# Patient Record
Sex: Male | Born: 1955 | Race: White | Hispanic: No | Marital: Married | Smoking: Never smoker
Health system: Southern US, Community
[De-identification: ages and names within clinical notes are randomized; demographics above are authoritative.]

---

## 2017-04-02 DIAGNOSIS — C61 Malignant neoplasm of prostate: Secondary | ICD-10-CM

## 2017-04-02 HISTORY — DX: Malignant neoplasm of prostate: C61

## 2017-06-28 ENCOUNTER — Encounter: Payer: Self-pay | Admitting: Family Medicine

## 2017-06-28 ENCOUNTER — Ambulatory Visit: Payer: 59 | Admitting: Family Medicine

## 2017-06-28 VITALS — BP 118/80 | HR 63 | Ht 68.0 in | Wt 160.4 lb

## 2017-06-28 DIAGNOSIS — E7800 Pure hypercholesterolemia, unspecified: Secondary | ICD-10-CM | POA: Diagnosis not present

## 2017-06-28 DIAGNOSIS — E119 Type 2 diabetes mellitus without complications: Secondary | ICD-10-CM | POA: Diagnosis not present

## 2017-06-28 DIAGNOSIS — I10 Essential (primary) hypertension: Secondary | ICD-10-CM | POA: Insufficient documentation

## 2017-06-28 DIAGNOSIS — Z789 Other specified health status: Secondary | ICD-10-CM | POA: Diagnosis not present

## 2017-06-28 DIAGNOSIS — Z7289 Other problems related to lifestyle: Secondary | ICD-10-CM

## 2017-06-28 DIAGNOSIS — Z8546 Personal history of malignant neoplasm of prostate: Secondary | ICD-10-CM | POA: Diagnosis not present

## 2017-06-28 LAB — COMPREHENSIVE METABOLIC PANEL
ALBUMIN: 3.8 g/dL (ref 3.5–5.2)
ALK PHOS: 58 U/L (ref 39–117)
ALT: 108 U/L — ABNORMAL HIGH (ref 0–53)
AST: 46 U/L — ABNORMAL HIGH (ref 0–37)
BILIRUBIN TOTAL: 0.5 mg/dL (ref 0.2–1.2)
BUN: 19 mg/dL (ref 6–23)
CO2: 32 mEq/L (ref 19–32)
Calcium: 9.8 mg/dL (ref 8.4–10.5)
Chloride: 100 mEq/L (ref 96–112)
Creatinine, Ser: 1.43 mg/dL (ref 0.40–1.50)
GFR: 53.3 mL/min — ABNORMAL LOW (ref >60.00)
Glucose, Bld: 148 mg/dL — ABNORMAL HIGH (ref 70–99)
POTASSIUM: 3.7 meq/L (ref 3.5–5.1)
SODIUM: 139 meq/L (ref 135–145)
TOTAL PROTEIN: 6.3 g/dL (ref 6.0–8.3)

## 2017-06-28 LAB — URINALYSIS, ROUTINE W REFLEX MICROSCOPIC
BILIRUBIN URINE: NEGATIVE
Hgb urine dipstick: NEGATIVE
KETONES UR: NEGATIVE
LEUKOCYTES UA: NEGATIVE
NITRITE: NEGATIVE
RBC / HPF: NONE SEEN (ref 0–?)
Specific Gravity, Urine: 1.02 (ref 1.000–1.030)
Total Protein, Urine: 30 — AB
URINE GLUCOSE: NEGATIVE
Urobilinogen, UA: 0.2 (ref 0.0–1.0)
pH: 6.5 (ref 5.0–8.0)

## 2017-06-28 LAB — LIPID PANEL
CHOLESTEROL: 116 mg/dL (ref 0–200)
HDL: 60.1 mg/dL (ref >39.00)
LDL Cholesterol: 44 mg/dL (ref 0–99)
NonHDL: 55.55
Total CHOL/HDL Ratio: 2
Triglycerides: 60 mg/dL (ref 0.0–149.0)
VLDL: 12 mg/dL (ref 0.0–40.0)

## 2017-06-28 LAB — MICROALBUMIN / CREATININE URINE RATIO
Creatinine,U: 147.8 mg/dL
Microalb Creat Ratio: 16.8 mg/g (ref 0.0–30.0)
Microalb, Ur: 24.8 mg/dL — ABNORMAL HIGH (ref 0.0–1.9)

## 2017-06-28 LAB — CBC
HEMATOCRIT: 42.2 % (ref 39.0–52.0)
Hemoglobin: 13.8 g/dL (ref 13.0–17.0)
MCHC: 32.8 g/dL (ref 30.0–36.0)
MCV: 81.8 fl (ref 78.0–100.0)
Platelets: 165 10*3/uL (ref 150.0–400.0)
RBC: 5.15 Mil/uL (ref 4.22–5.81)
RDW: 13.4 % (ref 11.5–15.5)
WBC: 6.4 10*3/uL (ref 4.0–10.5)

## 2017-06-28 LAB — HEMOGLOBIN A1C: Hgb A1c MFr Bld: 6.3 % (ref 4.6–6.5)

## 2017-06-28 MED ORDER — ATORVASTATIN CALCIUM 40 MG PO TABS: 40.0000 mg | ORAL_TABLET | Freq: Every day | ORAL | 1 refills | Status: DC

## 2017-06-28 MED ORDER — LISINOPRIL 40 MG PO TABS: 40.0000 mg | ORAL_TABLET | Freq: Every day | ORAL | 1 refills | Status: DC

## 2017-06-28 MED ORDER — METFORMIN HCL ER 500 MG PO TB24: 500.0000 mg | ORAL_TABLET | Freq: Every day | ORAL | 1 refills | Status: DC

## 2017-06-28 NOTE — Progress Notes (Signed)
Subjective:  Patient ID: Elijah Goodman, male    DOB: 1956-03-15  Age: 62 y.o. MRN: [id number]  CC: Establish Care   HPI Elijah Goodman presents for follow-up of his hypertension, elevated cholesterol diabetes.  He ran out of his chlorthalidone about a month or so ago and tells me that his blood pressure remains in the less than 130 over less than 90 range without the chlorthalidone.  He has also had a 20 pound intentional weight loss and believes that this may have helped.  He continues to be compliant with his atorvastatin and lisinopril his cholesterol has been well controlled as well.  His diabetes is been well controlled on a single dose of nightly metformin.  Had a colonoscopy this past year where 7 polyps were removed.  Past medical history of prostate cancer that was treated with seeding.  He continues to be followed by urology for this issue.  Tells me that he does consume a 12 pack of beer on most weekends.  He does not drink when he has his great grandson who is 70 years old for the weekend.  He lives with his girlfriend.  He helps his sister take care of his aging parents.  Outpatient Medications Prior to Visit  Medication Sig Dispense Refill  . atorvastatin (LIPITOR) 40 MG tablet Take 40 mg by mouth daily.    . chlorthalidone (HYGROTON) 25 MG tablet Take 25 mg by mouth daily.    Marland Kitchen lisinopril (PRINIVIL,ZESTRIL) 40 MG tablet Take 40 mg by mouth daily.    . metFORMIN (GLUCOPHAGE-XR) 500 MG 24 hr tablet Take 500 mg by mouth daily.     No facility-administered medications prior to visit.     ROS Review of Systems  Constitutional: Negative.  Negative for chills, fever and unexpected weight change.  HENT: Negative.   Eyes: Negative.   Respiratory: Negative for shortness of breath.   Cardiovascular: Negative for chest pain.  Gastrointestinal: Negative.   Endocrine: Negative for polyphagia and polyuria.  Genitourinary: Negative for difficulty urinating, hematuria and urgency.    Musculoskeletal: Negative for gait problem and joint swelling.  Skin: Negative.   Allergic/Immunologic: Negative for immunocompromised state.  Neurological: Negative for headaches.  Hematological: Does not bruise/bleed easily.  Psychiatric/Behavioral: Negative.     Objective:  BP 118/80 (BP Location: Left Arm, Patient Position: Sitting, Cuff Size: Normal)   Pulse 63   Ht 5\' 8"  (1.727 m)   Wt 160 lb 6 oz (72.7 kg)   SpO2 97%   BMI 24.38 kg/m   BP Readings from Last 3 Encounters:  06/28/17 118/80    Wt Readings from Last 3 Encounters:  06/28/17 160 lb 6 oz (72.7 kg)    Physical Exam  Constitutional: He is oriented to person, place, and time. He appears well-developed and well-nourished. No distress.  HENT:  Head: Normocephalic and atraumatic.  Right Ear: External ear normal.  Left Ear: External ear normal.  Mouth/Throat: Oropharynx is clear and moist. No oropharyngeal exudate.  Eyes: Pupils are equal, round, and reactive to light. Conjunctivae are normal. Right eye exhibits no discharge. Left eye exhibits no discharge. No scleral icterus.  Neck: Neck supple. No JVD present. No tracheal deviation present. No thyromegaly present.  Cardiovascular: Normal rate, regular rhythm and normal heart sounds.  Pulmonary/Chest: Effort normal and breath sounds normal. No stridor.  Abdominal: Soft. Bowel sounds are normal. He exhibits no distension. There is no tenderness. There is no rebound and no guarding.  Lymphadenopathy:    He  has no cervical adenopathy.  Neurological: He is alert and oriented to person, place, and time.  Skin: Skin is warm and dry. He is not diaphoretic.  Psychiatric: He has a normal mood and affect. His behavior is normal.    No results found for: WBC, HGB, HCT, PLT, GLUCOSE, CHOL, TRIG, HDL, LDLDIRECT, LDLCALC, ALT, AST, NA, K, CL, CREATININE, BUN, CO2, TSH, PSA, INR, GLUF, HGBA1C, MICROALBUR  Patient was never admitted.  Assessment & Plan:   Elijah Goodman was seen  today for establish care.  Diagnoses and all orders for this visit:  Essential hypertension -     lisinopril (PRINIVIL,ZESTRIL) 40 MG tablet; Take 1 tablet (40 mg total) by mouth daily. -     CBC -     Comprehensive metabolic panel -     Urinalysis, Routine w reflex microscopic  Elevated LDL cholesterol level -     atorvastatin (LIPITOR) 40 MG tablet; Take 1 tablet (40 mg total) by mouth daily. -     Comprehensive metabolic panel -     Lipid panel  Type 2 diabetes mellitus without complication, without long-term current use of insulin (HCC) -     metFORMIN (GLUCOPHAGE-XR) 500 MG 24 hr tablet; Take 1 tablet (500 mg total) by mouth at bedtime. -     CBC -     Comprehensive metabolic panel -     Urinalysis, Routine w reflex microscopic -     Microalbumin / creatinine urine ratio -     Hemoglobin A1c  Alcohol use  History of prostate cancer   I have discontinued Elijah Goodman's chlorthalidone. I have also changed his atorvastatin, lisinopril, and metFORMIN.  Meds ordered this encounter  Medications  . atorvastatin (LIPITOR) 40 MG tablet    Sig: Take 1 tablet (40 mg total) by mouth daily.    Dispense:  100 tablet    Refill:  1  . lisinopril (PRINIVIL,ZESTRIL) 40 MG tablet    Sig: Take 1 tablet (40 mg total) by mouth daily.    Dispense:  100 tablet    Refill:  1  . metFORMIN (GLUCOPHAGE-XR) 500 MG 24 hr tablet    Sig: Take 1 tablet (500 mg total) by mouth at bedtime.    Dispense:  100 tablet    Refill:  1   Patient will continue to monitor his blood pressure.  Expressed some concern about his alcohol usage on the weekends.  Information was given to the patient regarding this matter.  Follow-up: Return in about 6 months (around 12/29/2017).  Elijah Maw, MD

## 2017-06-28 NOTE — Patient Instructions (Signed)
DASH Eating Plan DASH stands for "Dietary Approaches to Stop Hypertension." The DASH eating plan is a healthy eating plan that has been shown to reduce high blood pressure (hypertension). It may also reduce your risk for type 2 diabetes, heart disease, and stroke. The DASH eating plan may also help with weight loss. What are tips for following this plan? General guidelines  Avoid eating more than 2,300 mg (milligrams) of salt (sodium) a day. If you have hypertension, you may need to reduce your sodium intake to 1,500 mg a day.  Limit alcohol intake to no more than 1 drink a day for nonpregnant women and 2 drinks a day for men. One drink equals 12 oz of beer, 5 oz of wine, or 1 oz of hard liquor.  Work with your health care provider to maintain a healthy body weight or to lose weight. Ask what an ideal weight is for you.  Get at least 30 minutes of exercise that causes your heart to beat faster (aerobic exercise) most days of the week. Activities may include walking, swimming, or biking.  Work with your health care provider or diet and nutrition specialist (dietitian) to adjust your eating plan to your individual calorie needs. Reading food labels  Check food labels for the amount of sodium per serving. Choose foods with less than 5 percent of the Daily Value of sodium. Generally, foods with less than 300 mg of sodium per serving fit into this eating plan.  To find whole grains, look for the word "whole" as the first word in the ingredient list. Shopping  Buy products labeled as "low-sodium" or "no salt added."  Buy fresh foods. Avoid canned foods and premade or frozen meals. Cooking  Avoid adding salt when cooking. Use salt-free seasonings or herbs instead of table salt or sea salt. Check with your health care provider or pharmacist before using salt substitutes.  Do not fry foods. Cook foods using healthy methods such as baking, boiling, grilling, and broiling instead.  Cook with  heart-healthy oils, such as olive, canola, soybean, or sunflower oil. Meal planning   Eat a balanced diet that includes: ? 5 or more servings of fruits and vegetables each day. At each meal, try to fill half of your plate with fruits and vegetables. ? Up to 6-8 servings of whole grains each day. ? Less than 6 oz of lean meat, poultry, or fish each day. A 3-oz serving of meat is about the same size as a deck of cards. One egg equals 1 oz. ? 2 servings of low-fat dairy each day. ? A serving of nuts, seeds, or beans 5 times each week. ? Heart-healthy fats. Healthy fats called Omega-3 fatty acids are found in foods such as flaxseeds and coldwater fish, like sardines, salmon, and mackerel.  Limit how much you eat of the following: ? Canned or prepackaged foods. ? Food that is high in trans fat, such as fried foods. ? Food that is high in saturated fat, such as fatty meat. ? Sweets, desserts, sugary drinks, and other foods with added sugar. ? Full-fat dairy products.  Do not salt foods before eating.  Try to eat at least 2 vegetarian meals each week.  Eat more home-cooked food and less restaurant, buffet, and fast food.  When eating at a restaurant, ask that your food be prepared with less salt or no salt, if possible. What foods are recommended? The items listed may not be a complete list. Talk with your dietitian about what   dietary choices are best for you. Grains Whole-grain or whole-wheat bread. Whole-grain or whole-wheat pasta. Brown rice. Oatmeal. Quinoa. Bulgur. Whole-grain and low-sodium cereals. Pita bread. Low-fat, low-sodium crackers. Whole-wheat flour tortillas. Vegetables Fresh or frozen vegetables (raw, steamed, roasted, or grilled). Low-sodium or reduced-sodium tomato and vegetable juice. Low-sodium or reduced-sodium tomato sauce and tomato paste. Low-sodium or reduced-sodium canned vegetables. Fruits All fresh, dried, or frozen fruit. Canned fruit in natural juice (without  added sugar). Meat and other protein foods Skinless chicken or turkey. Ground chicken or turkey. Pork with fat trimmed off. Fish and seafood. Egg whites. Dried beans, peas, or lentils. Unsalted nuts, nut butters, and seeds. Unsalted canned beans. Lean cuts of beef with fat trimmed off. Low-sodium, lean deli meat. Dairy Low-fat (1%) or fat-free (skim) milk. Fat-free, low-fat, or reduced-fat cheeses. Nonfat, low-sodium ricotta or cottage cheese. Low-fat or nonfat yogurt. Low-fat, low-sodium cheese. Fats and oils Soft margarine without trans fats. Vegetable oil. Low-fat, reduced-fat, or light mayonnaise and salad dressings (reduced-sodium). Canola, safflower, olive, soybean, and sunflower oils. Avocado. Seasoning and other foods Herbs. Spices. Seasoning mixes without salt. Unsalted popcorn and pretzels. Fat-free sweets. What foods are not recommended? The items listed may not be a complete list. Talk with your dietitian about what dietary choices are best for you. Grains Baked goods made with fat, such as croissants, muffins, or some breads. Dry pasta or rice meal packs. Vegetables Creamed or fried vegetables. Vegetables in a cheese sauce. Regular canned vegetables (not low-sodium or reduced-sodium). Regular canned tomato sauce and paste (not low-sodium or reduced-sodium). Regular tomato and vegetable juice (not low-sodium or reduced-sodium). Pickles. Olives. Fruits Canned fruit in a light or heavy syrup. Fried fruit. Fruit in cream or butter sauce. Meat and other protein foods Fatty cuts of meat. Ribs. Fried meat. Bacon. Sausage. Bologna and other processed lunch meats. Salami. Fatback. Hotdogs. Bratwurst. Salted nuts and seeds. Canned beans with added salt. Canned or smoked fish. Whole eggs or egg yolks. Chicken or turkey with skin. Dairy Whole or 2% milk, cream, and half-and-half. Whole or full-fat cream cheese. Whole-fat or sweetened yogurt. Full-fat cheese. Nondairy creamers. Whipped toppings.  Processed cheese and cheese spreads. Fats and oils Butter. Stick margarine. Lard. Shortening. Ghee. Bacon fat. Tropical oils, such as coconut, palm kernel, or palm oil. Seasoning and other foods Salted popcorn and pretzels. Onion salt, garlic salt, seasoned salt, table salt, and sea salt. Worcestershire sauce. Tartar sauce. Barbecue sauce. Teriyaki sauce. Soy sauce, including reduced-sodium. Steak sauce. Canned and packaged gravies. Fish sauce. Oyster sauce. Cocktail sauce. Horseradish that you find on the shelf. Ketchup. Mustard. Meat flavorings and tenderizers. Bouillon cubes. Hot sauce and Tabasco sauce. Premade or packaged marinades. Premade or packaged taco seasonings. Relishes. Regular salad dressings. Where to find more information:  National Heart, Lung, and Blood Institute: www.nhlbi.nih.gov  American Heart Association: www.heart.org Summary  The DASH eating plan is a healthy eating plan that has been shown to reduce high blood pressure (hypertension). It may also reduce your risk for type 2 diabetes, heart disease, and stroke.  With the DASH eating plan, you should limit salt (sodium) intake to 2,300 mg a day. If you have hypertension, you may need to reduce your sodium intake to 1,500 mg a day.  When on the DASH eating plan, aim to eat more fresh fruits and vegetables, whole grains, lean proteins, low-fat dairy, and heart-healthy fats.  Work with your health care provider or diet and nutrition specialist (dietitian) to adjust your eating plan to your individual   calorie needs. This information is not intended to replace advice given to you by your health care provider. Make sure you discuss any questions you have with your health care provider. Document Released: 03/08/2011 Document Revised: 03/12/2016 Document Reviewed: 03/12/2016 Elsevier Interactive Patient Education  2018 [employer].  What You Need To Know About Alcohol Abuse and Dependence, Adult Alcohol is a widely  available drug. People who use alcohol will consume it in varying amounts. People who drink alcohol in excess, and have behavior problems during and after drinking alcohol, may have what is called an alcohol use disorder. Alcohol abuse and alcohol dependence are the two main types of alcohol use disorders:  Alcohol abuse is when you use alcohol too much or too often. You may use alcohol to make yourself feel happy or to reduce stress, but you may have a hard time setting a limit on the amount you drink.  Alcohol dependence is when you use alcohol excessively for a period of time, and your body and brain chemistry changes as a result. This can make it hard to stop drinking because you may start to feel sick or feel different when you do not use alcohol.  How can alcohol abuse and dependence affect me? Alcohol abuse and dependence can have a negative effect on your life. Excessive use of alcohol may lead to an addiction. You may feel like you need alcohol to function normally. You may drink alcohol before work in the morning, during the day, or as soon as you get home from work in the evening. These actions can result in:  Poor performance at work.  Losing your job.  Financial problems.  Car crashes or criminal charges from driving after drinking alcohol.  Problems in your relationships with friends and family.  Losing the trust and respect of co-workers, friends, and family.  Drinking heavily over a long period of time can permanently damage your body and brain, and can cause lifelong health issues, such as:  Liver disease.  Heart problems, high blood pressure, or stroke.  Damage to your pancreas.  Certain cancers.  Decreased ability to fight infections.  Numbness or tingling in hands or feet (neuropathy).  Brain damage.  Depression.  Early (premature) death.  When your body craves alcohol, it is easy to drink more than your body can handle. As a result, you may overdose.  Alcohol overdose is a serious situation that requires hospitalization. It may lead to permanent injuries or death. What are the benefits of avoiding alcohol use? Limiting or avoiding alcohol can help you:  Avoid risks to your body, brain, and relationships.  Avoid the risk of abusing or becoming dependent on alcohol.  Keep your mind and body healthy. As a result, you may be more likely to accomplish your life goals.  Avoid permanent injury, organ damage, or death due to alcohol use.  What steps can I take to stop drinking?  The best way to avoid alcohol abuse, dependence, and addiction is not to drink at all, or to drink measured amounts. Measured drinking means no more than 1 drink a day for nonpregnant women and 2 drinks a day for men. One drink equals 12 oz of beer, 5 oz of wine, or 1 oz of hard liquor.  Stop drinking if you have been drinking too much. This can be very hard to do if you are used to abusing alcohol. If you find it hard to stop drinking, talk about your experience with someone you trust. This  person may be able to help you change your drinking behavior.  Instead of drinking alcohol, do something else, like a hobby or exercise.  Find healthy ways to cope with stress, such as exercise, meditation, or spending time with people you care about.  In social gatherings and places where there may be alcohol, make intentional choices to drink non-alcohol beverages.  If your family, co-workers, or friends drink, talk to them about supporting you in your efforts to stop drinking. Ask them not to drink around you. Spend more time with people who do not drink alcohol.  If you think that you have an alcohol dependency problem: ? Tell friends or family about your concerns. ? Talk with your health care provider or another health professional about where to get help. ? Work with a Transport planner and a Regulatory affairs officer. ? Consider joining a support group for people who struggle  with alcohol abuse, dependence, and addiction. Where to find support: You can get support for preventing alcohol abuse, dependence, and addiction from:  Your health care provider.  Alcoholics Anonymous (AA): NicTax.com.pt  SMART Recovery: www.smartrecovery.org  Local treatment centers or chemical dependency counselors.  Where to find more information: Learn more about alcohol abuse and dependence from:  Centers for Disease Control and Prevention: GoalForum.com.au  [employer] on Alcohol Abuse and Alcoholism: https://clark.org/  Local AA groups in your community.  Contact a health care provider if:  You drink more or for longer than you intended, on more than one occasion.  You tried to stop drinking or to cut back on how much you drink, but you were not able to.  You often drink to the point of vomiting or passing out.  You want to drink so badly that you cannot think about anything else.  Drinking has created problems in your life, but you continue to drink.  You keep drinking even though you feel anxious, depressed, or have experienced memory loss.  You have stopped doing the things you used to enjoy in order to drink.  You have to drink more than you used to in order to get the effect you want.  You experience anxiety, sweating, nausea, shakiness, and trouble sleeping when you try to stop drinking.  You have thoughts about hurting yourself or others. If you ever feel like you may hurt yourself or others, or have thoughts about taking your own life, get help right away. You can go to your nearest emergency department or call:  Your local emergency services (911 in the [place]  A suicide crisis helpline, such as the [place] at (404)791-6426. This is open 24 hours a day.  Summary  Alcohol is a widely available drug. Misusing, abusing, and becoming dependent on  alcohol can cause many problems.  It is important to measure and limit the amount of alcohol you consume. It is recommended to limit alcohol use to 1 drink a day for nonpregnant women and 2 drinks a day for men.  The risks associated with drinking too much will have a direct negative impact on your work, relationships, and health.  If you realize that you are having some challenges keeping your drinking under control, find some ways to change your behavior. Hobbies, self calming activities, exercise, or support groups can help.  If you feel you need help with changing your drinking habits, talk with your health care provider, a good friend, or a therapist, or go to an [place] group. This information is not intended to  replace advice given to you by your health care provider. Make sure you discuss any questions you have with your health care provider. Document Released: 03/13/2016 Document Revised: 03/13/2016 Document Reviewed: 03/13/2016 Elsevier Interactive Patient Education  [employer].

## 2017-07-04 ENCOUNTER — Other Ambulatory Visit: Payer: Self-pay

## 2017-07-04 DIAGNOSIS — R748 Abnormal levels of other serum enzymes: Secondary | ICD-10-CM

## 2017-07-24 ENCOUNTER — Telehealth: Payer: Self-pay

## 2017-07-24 DIAGNOSIS — Z8546 Personal history of malignant neoplasm of prostate: Secondary | ICD-10-CM

## 2017-07-24 NOTE — Telephone Encounter (Signed)
Referral entered as requested by Dr. Puschinsky's office.    Copied from [place]. Topic: Referral - Request >> Jul 24, 2017 10:53 AM Bea Graff, NT wrote: Reason for CRM: Dawn with Dr. Harlow Asa, urologist in [facility] is needing a new referral with the pts [facility] insurance so they can continue to treat pt for prostate cancer. CB#: [phone]

## 2017-07-25 ENCOUNTER — Telehealth: Payer: Self-pay

## 2017-07-25 NOTE — Telephone Encounter (Signed)
WK-Plz see message below/pt's ins req new referral for new insurance to continue Tx of Prostate Cancer/Dr. Puschinsky's office would like Tanya B to call back at [phone]/plz advise/thx dmf  Copied from [place]. Topic: Referral - Request >> Jul 24, 2017 10:53 AM Bea Graff, NT wrote: Reason for CRM: Dawn with Dr. Harlow Asa, urologist in [facility] is needing a new referral with the pts [facility] insurance so they can continue to treat pt for prostate cancer. CB#: [phone]  >> Jul 25, 2017  1:49 PM Conception Chancy, NT wrote: Dr. Harlow Asa office is calling and would like Tanya to call back. Please advise.  [phone]

## 2017-09-02 DIAGNOSIS — C61 Malignant neoplasm of prostate: Secondary | ICD-10-CM | POA: Diagnosis not present

## 2017-09-17 ENCOUNTER — Ambulatory Visit (INDEPENDENT_AMBULATORY_CARE_PROVIDER_SITE_OTHER): Payer: 59

## 2017-09-17 ENCOUNTER — Ambulatory Visit: Payer: 59 | Admitting: Family Medicine

## 2017-09-17 ENCOUNTER — Encounter: Payer: Self-pay | Admitting: Family Medicine

## 2017-09-17 VITALS — BP 138/90 | HR 87 | Temp 98.0°F | Ht 68.0 in | Wt 156.4 lb

## 2017-09-17 DIAGNOSIS — I10 Essential (primary) hypertension: Secondary | ICD-10-CM

## 2017-09-17 DIAGNOSIS — M25511 Pain in right shoulder: Secondary | ICD-10-CM | POA: Diagnosis not present

## 2017-09-17 DIAGNOSIS — R51 Headache: Secondary | ICD-10-CM

## 2017-09-17 DIAGNOSIS — Z789 Other specified health status: Secondary | ICD-10-CM

## 2017-09-17 DIAGNOSIS — R748 Abnormal levels of other serum enzymes: Secondary | ICD-10-CM

## 2017-09-17 DIAGNOSIS — E119 Type 2 diabetes mellitus without complications: Secondary | ICD-10-CM | POA: Diagnosis not present

## 2017-09-17 DIAGNOSIS — R519 Headache, unspecified: Secondary | ICD-10-CM

## 2017-09-17 DIAGNOSIS — Z7289 Other problems related to lifestyle: Secondary | ICD-10-CM

## 2017-09-17 LAB — COMPREHENSIVE METABOLIC PANEL
ALBUMIN: 4.1 g/dL (ref 3.5–5.2)
ALK PHOS: 70 U/L (ref 39–117)
ALT: 38 U/L (ref 0–53)
AST: 20 U/L (ref 0–37)
BUN: 17 mg/dL (ref 6–23)
CO2: 27 mEq/L (ref 19–32)
Calcium: 10 mg/dL (ref 8.4–10.5)
Chloride: 103 mEq/L (ref 96–112)
Creatinine, Ser: 0.79 mg/dL (ref 0.40–1.50)
GFR: 105.64 mL/min (ref >60.00)
GLUCOSE: 95 mg/dL (ref 70–99)
POTASSIUM: 3.6 meq/L (ref 3.5–5.1)
Sodium: 138 mEq/L (ref 135–145)
TOTAL PROTEIN: 6.5 g/dL (ref 6.0–8.3)
Total Bilirubin: 0.3 mg/dL (ref 0.2–1.2)

## 2017-09-17 LAB — GAMMA GT: GGT: 28 U/L (ref 7–51)

## 2017-09-17 MED ORDER — DICLOFENAC SODIUM 1 % TD GEL: TRANSDERMAL | 0 refills | Status: DC
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
## 2017-09-18 LAB — HEPATITIS B SURFACE ANTIGEN: HEP B S AG: NONREACTIVE

## 2017-09-20 LAB — ETHYL GLUCURONIDE LC/MS/MS
ETHYL GLUCURONIDE LC/MS/MS: 11571 ng/mL
ETHYL SULFATE: POSITIVE — AB
EtG/EtS LC/MS/MS: POSITIVE — AB
Ethyl Glucuronide: POSITIVE — AB
Ethyl Sulfate LC/MS/MS: 2975 ng/mL

## 2017-09-20 LAB — ETHYL GLUCURONIDE, URINE

## 2017-09-23 ENCOUNTER — Telehealth: Payer: Self-pay

## 2017-09-23 MED ORDER — LISINOPRIL 40 MG PO TABS: 40.0000 mg | ORAL_TABLET | Freq: Every day | ORAL | 0 refills | Status: DC

## 2017-09-23 MED ORDER — METFORMIN HCL ER 500 MG PO TB24: 500.0000 mg | ORAL_TABLET | Freq: Every day | ORAL | 0 refills | Status: DC

## 2017-09-23 NOTE — Telephone Encounter (Signed)
Copied from [place] [id number]. Topic: Quick Communication - Lab Results >> Sep 18, 2017  5:03 PM Oliver Pila B wrote: Reason for CRM: call pt when image results are ready  >> Sep 20, 2017  4:47 PM Selinda Flavin B, NT wrote: Patient calling in regards to xray image results. States it has been a few days. Please advise. CB#: [phone]

## 2017-09-23 NOTE — Addendum Note (Signed)
Addended by: Abelino Derrick A on: 09/23/2017 11:45 AM   Modules accepted: Orders

## 2017-11-04 ENCOUNTER — Encounter: Payer: Self-pay | Admitting: Family Medicine

## 2017-11-04 ENCOUNTER — Ambulatory Visit: Payer: 59 | Admitting: Family Medicine

## 2017-11-04 VITALS — BP 132/70 | HR 89 | Temp 98.3°F | Ht 68.0 in | Wt 156.2 lb

## 2017-11-04 DIAGNOSIS — M25511 Pain in right shoulder: Secondary | ICD-10-CM | POA: Diagnosis not present

## 2017-11-04 MED ORDER — METHYLPREDNISOLONE ACETATE 40 MG/ML IJ SUSP: 40.0000 mg | Freq: Once | INTRAMUSCULAR | Status: DC

## 2017-11-04 NOTE — Progress Notes (Signed)
Elijah Goodman - 62 y.o. male MRN [id number]  Date of birth: [birth year]  Subjective Chief Complaint  Patient presents with  . Shoulder Pain    pain ahs gotten worse, had cortisone shot about a month ago amd now the pain radiates down to elbow, cannot lift arm about shoulder at all.    HPI Elijah Goodman is a 62 y.o. male here today with complaint of R shoulder pain.  He was seen for this previously on 09/17/17 and received an injection to the R shoulder. He states that this helped for about 4 weeks and then the shoulder started bothering him again.  He has difficulty lifting the arm and with internal rotation of the shoulder.  He denies weakness into the arm, numbness, tingling, neck pain, fever, or chills.   ROS:  A comprehensive ROS was completed and negative except as noted per HPI  No Known Allergies  History reviewed. No pertinent past medical history.  History reviewed. No pertinent surgical history.  Social History   Socioeconomic History  . Marital status: Married    Spouse name: Not on file  . Number of children: Not on file  . Years of education: Not on file  . Highest education level: Not on file  Occupational History  . Not on file  Social Needs  . Financial resource strain: Not on file  . Food insecurity:    Worry: Not on file    Inability: Not on file  . Transportation needs:    Medical: Not on file    Non-medical: Not on file  Tobacco Use  . Smoking status: Never Smoker  . Smokeless tobacco: Never Used  Substance and Sexual Activity  . Alcohol use: Not on file  . Drug use: Not on file  . Sexual activity: Not on file  Lifestyle  . Physical activity:    Days per week: Not on file    Minutes per session: Not on file  . Stress: Not on file  Relationships  . Social connections:    Talks on phone: Not on file    Gets together: Not on file    Attends religious service: Not on file    Active member of club or organization: Not on file    Attends meetings of  clubs or organizations: Not on file    Relationship status: Not on file  Other Topics Concern  . Not on file  Social History Narrative  . Not on file    History reviewed. No pertinent family history.  Health Maintenance  Topic Date Due  . Hepatitis C Screening  March 08, 1956  . PNEUMOCOCCAL POLYSACCHARIDE VACCINE (1) 09/26/1957  . FOOT EXAM  09/26/1965  . OPHTHALMOLOGY EXAM  09/26/1965  . HIV Screening  09/27/1970  . TETANUS/TDAP  09/27/1974  . COLONOSCOPY  09/26/2005  . INFLUENZA VACCINE  10/31/2017  . HEMOGLOBIN A1C  12/29/2017    ----------------------------------------------------------------------------------------------------------------------------------------------------------------------------------------------------------------- Physical Exam BP 132/70 (BP Location: Left Arm, Patient Position: Sitting, Cuff Size: Normal)   Pulse 89   Temp 98.3 F (36.8 C) (Oral)   Ht 5\' 8"  (1.727 m)   Wt 156 lb 3.2 oz (70.9 kg)   SpO2 97%   BMI 23.75 kg/m   Physical Exam  Constitutional: He is oriented to person, place, and time. He appears well-nourished. No distress.  HENT:  Head: Normocephalic and atraumatic.  Neck: Normal range of motion. Neck supple.  Cardiovascular: Normal rate and regular rhythm.  Pulmonary/Chest: Effort normal and breath sounds normal.  Musculoskeletal:  R shoulder normal to inspection and palpation.  No ttp along biciptal groove.  He has limited ROM especially with abduction, internal rotation and cross body movement.  He has a +empty can.  Neurological: He is alert and oriented to person, place, and time.  Skin: Skin is warm and dry. No rash noted.  Psychiatric: He has a normal mood and affect. His behavior is normal.   Procedure note:  Reviewed procedure of subacromial injection and all questions answered.  Consent obtained.  The R shoulder was then prepped in typical sterile fashion using betadine x3.  A cold spray was applied and the R subacromial  space was injected using a posterior approach with 78ml of 40mg /mL depo-medrol and 87mL of 2% licdocaine.  A band aid was applied.  He tolerated procedure well and post procedure instructions were given.  ------------------------------------------------------------------------------------------------------------------------------------------------------------------------------------------------------------------- Assessment and Plan  Acute pain of right shoulder Likely rotator cuff tendinitis/subacromial bursitis.  Discussed repeat injection.  >6 weeks since last injection.  Agreed to have repeat injection, see procedure note. Some relief immediately after injection.  If not improving with this, recommend referral to sports medicine.

## 2017-11-04 NOTE — Patient Instructions (Signed)
Rotator Cuff Tendinitis Rotator cuff tendinitis is inflammation of the tough, cord-like bands that connect muscle to bone (tendons) in the rotator cuff. The rotator cuff includes all of the muscles and tendons that connect the arm to the shoulder. The rotator cuff holds the head of the upper arm bone (humerus) in the cup (fossa) of the shoulder blade (scapula). This condition can lead to a long-lasting (chronic) tear. The tear may be partial or complete. What are the causes? This condition is usually caused by overusing the rotator cuff. What increases the risk? This condition is more likely to develop in athletes and workers who frequently use their shoulder or reach over their heads. This can include activities such as:  Tennis.  Baseball or softball.  Swimming.  Construction work.  Painting.  What are the signs or symptoms? Symptoms of this condition include:  Pain spreading (radiating) from the shoulder to the upper arm.  Swelling and tenderness in front of the shoulder.  Pain when reaching, pulling, or lifting the arm above the head.  Pain when lowering the arm from above the head.  Minor pain in the shoulder when resting.  Increased pain in the shoulder at night.  Difficulty placing the arm behind the back.  How is this diagnosed? This condition is diagnosed with a medical history and physical exam. Tests may also be done, including:  X-rays.  MRI.  Ultrasounds.  CT or MR arthrogram. During this test, a contrast material is injected and then images are taken.  How is this treated? Treatment for this condition depends on the severity of the condition. In less severe cases, treatment may include:  Rest. This may be done with a sling that holds the shoulder still (immobilization). Your health care provider may also recommend avoiding activities that involve lifting your arm over your head.  Icing the shoulder.  Anti-inflammatory medicines, such as aspirin or  ibuprofen.  In more severe cases, treatment may include:  Physical therapy.  Steroid injections.  Surgery.  Follow these instructions at home: If you have a sling:  Wear the sling as told by your health care provider. Remove it only as told by your health care provider.  Loosen the sling if your fingers tingle, become numb, or turn cold and blue.  Keep the sling clean.  If the sling is not waterproof, do not let it get wet. Remove it, if allowed, or cover it with a watertight covering when you take a bath or shower. Managing pain, stiffness, and swelling  If directed, put ice on the injured area. ? If you have a removable sling, remove it as told by your health care provider. ? Put ice in a plastic bag. ? Place a towel between your skin and the bag. ? Leave the ice on for 20 minutes, 2-3 times a day.  Move your fingers often to avoid stiffness and to lessen swelling.  Raise (elevate) the injured area above the level of your heart while you are lying down.  Find a comfortable sleeping position or sleep on a recliner, if available. Driving  Do not drive or use heavy machinery while taking prescription pain medicine.  Ask your health care provider when it is safe to drive if you have a sling on your arm. Activity  Rest your shoulder as told by your health care provider.  Return to your normal activities as told by your health care provider. Ask your health care provider what activities are safe for you.  Do any   exercises or stretches as told by your health care provider.  If you do repetitive overhead tasks, take small breaks in between and include stretching exercises as told by your health care provider. General instructions  Do not use any products that contain nicotine or tobacco, such as cigarettes and e-cigarettes. These can delay healing. If you need help quitting, ask your health care provider.  Take over-the-counter and prescription medicines only as told by  your health care provider.  Keep all follow-up visits as told by your health care provider. This is important. Contact a health care provider if:  Your pain gets worse.  You have new pain in your arm, hands, or fingers.  Your pain is not relieved with medicine or does not get better after 6 weeks of treatment.  You have cracking sensations when moving your shoulder in certain directions.  You hear a snapping sound after using your shoulder, followed by severe pain and weakness. Get help right away if:  Your arm, hand, or fingers are numb or tingling.  Your arm, hand, or fingers are swollen or painful or they turn white or blue. Summary  Rotator cuff tendinitis is inflammation of the tough, cord-like bands that connect muscle to bone (tendons) in the rotator cuff.  This condition is usually caused by overusing the rotator cuff, which includes all of the muscles and tendons that connect the arm to the shoulder.  This condition is more likely to develop in athletes and workers who frequently use their shoulder or reach over their heads.  Treatment generally includes rest, anti-inflammatory medicines, and icing. In some cases, physical therapy and steroid injections may be needed. In severe cases, surgery may be needed. This information is not intended to replace advice given to you by your health care provider. Make sure you discuss any questions you have with your health care provider. Document Released: 06/09/2003 Document Revised: 03/05/2016 Document Reviewed: 03/05/2016 Elsevier Interactive Patient Education  2017 [employer]  

## 2017-11-04 NOTE — Assessment & Plan Note (Signed)
Likely rotator cuff tendinitis/subacromial bursitis.  Discussed repeat injection.  >6 weeks since last injection.  Agreed to have repeat injection, see procedure note. Some relief immediately after injection.  If not improving with this, recommend referral to sports medicine.

## 2018-01-14 ENCOUNTER — Other Ambulatory Visit: Payer: Self-pay | Admitting: Family Medicine

## 2018-01-14 DIAGNOSIS — I10 Essential (primary) hypertension: Secondary | ICD-10-CM

## 2018-02-25 ENCOUNTER — Other Ambulatory Visit: Payer: Self-pay | Admitting: Family Medicine

## 2018-02-25 DIAGNOSIS — E119 Type 2 diabetes mellitus without complications: Secondary | ICD-10-CM

## 2018-03-03 ENCOUNTER — Other Ambulatory Visit: Payer: Self-pay | Admitting: Family Medicine

## 2018-03-03 DIAGNOSIS — E7800 Pure hypercholesterolemia, unspecified: Secondary | ICD-10-CM

## 2018-06-09 ENCOUNTER — Other Ambulatory Visit: Payer: Self-pay | Admitting: Family Medicine

## 2018-06-09 DIAGNOSIS — E119 Type 2 diabetes mellitus without complications: Secondary | ICD-10-CM

## 2018-06-09 DIAGNOSIS — E7800 Pure hypercholesterolemia, unspecified: Secondary | ICD-10-CM

## 2018-07-21 ENCOUNTER — Other Ambulatory Visit: Payer: Self-pay | Admitting: Family Medicine

## 2018-07-21 DIAGNOSIS — I10 Essential (primary) hypertension: Secondary | ICD-10-CM

## 2018-09-11 ENCOUNTER — Other Ambulatory Visit: Payer: Self-pay | Admitting: Family Medicine

## 2018-09-11 DIAGNOSIS — E7800 Pure hypercholesterolemia, unspecified: Secondary | ICD-10-CM

## 2018-10-17 ENCOUNTER — Other Ambulatory Visit: Payer: Self-pay | Admitting: Family Medicine

## 2018-10-17 DIAGNOSIS — I10 Essential (primary) hypertension: Secondary | ICD-10-CM

## 2018-10-17 NOTE — Telephone Encounter (Signed)
Over due for follow up

## 2018-12-13 ENCOUNTER — Other Ambulatory Visit: Payer: Self-pay | Admitting: Family Medicine

## 2018-12-13 DIAGNOSIS — I10 Essential (primary) hypertension: Secondary | ICD-10-CM

## 2018-12-13 DIAGNOSIS — E119 Type 2 diabetes mellitus without complications: Secondary | ICD-10-CM

## 2018-12-13 DIAGNOSIS — E7800 Pure hypercholesterolemia, unspecified: Secondary | ICD-10-CM

## 2019-01-16 ENCOUNTER — Other Ambulatory Visit: Payer: Self-pay | Admitting: Family Medicine

## 2019-01-16 DIAGNOSIS — E119 Type 2 diabetes mellitus without complications: Secondary | ICD-10-CM

## 2019-01-16 DIAGNOSIS — I10 Essential (primary) hypertension: Secondary | ICD-10-CM

## 2019-02-24 IMAGING — DX DG SHOULDER 2+V*R*
3 series · 3 of 3 positions shown · non-contrast
Comparison: None.

CLINICAL DATA: Acute right shoulder pain for 2 weeks, no known
injury, initial encounter

EXAM:
RIGHT SHOULDER - 2+ VIEW

[shoulder (grashey) ap]
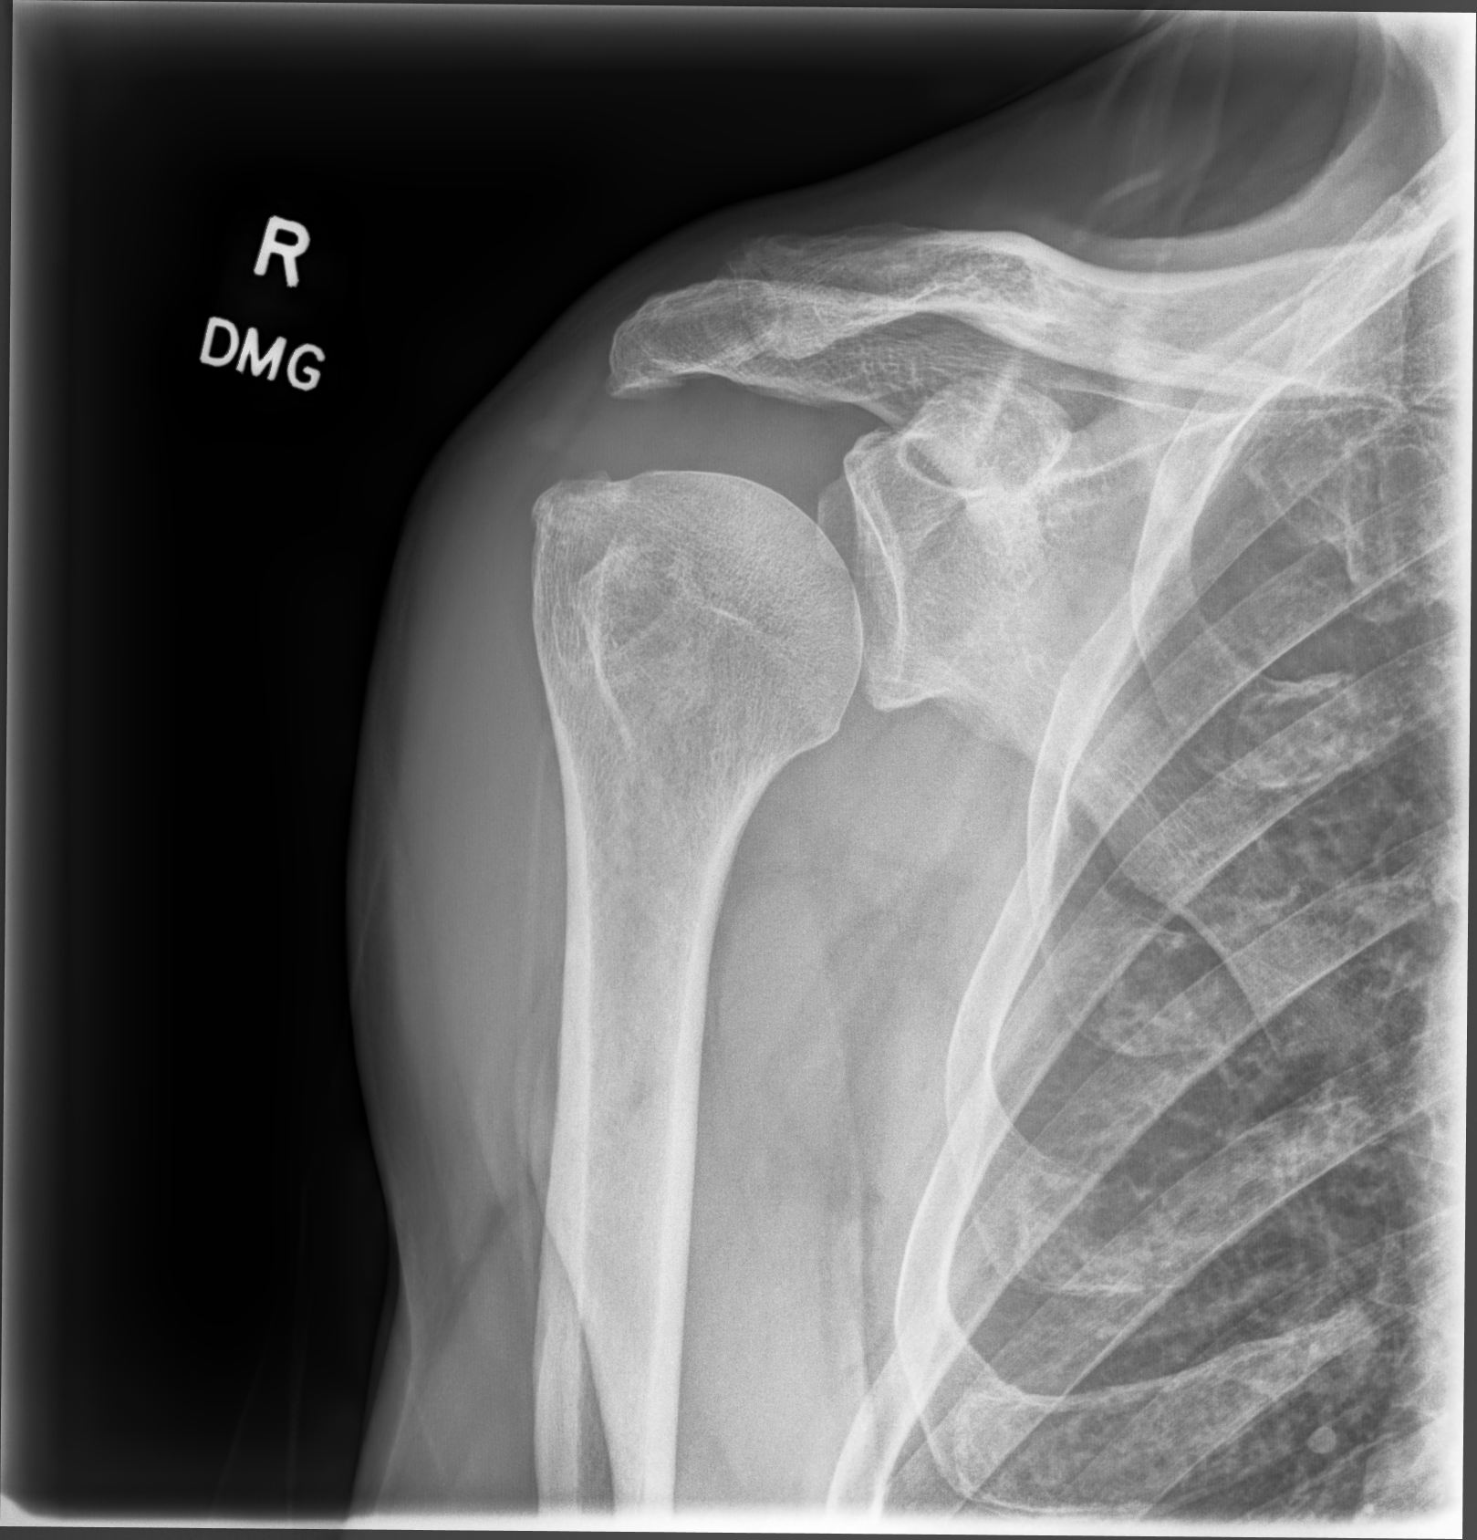

[shoulder y view]
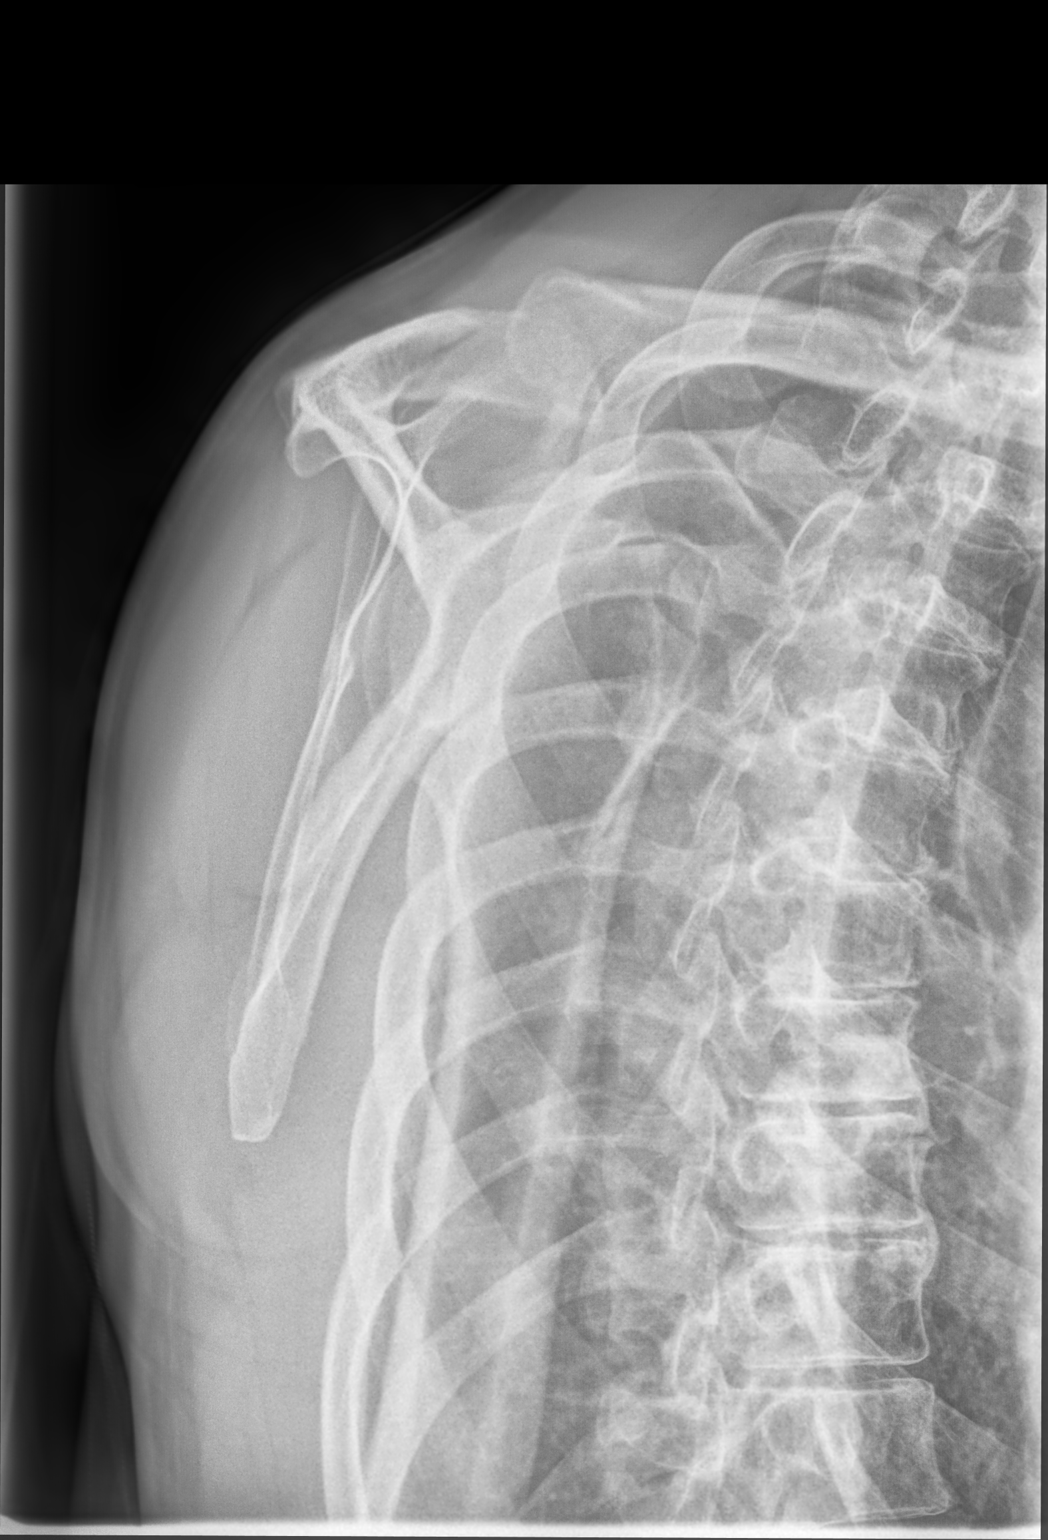

[shoulder axial]
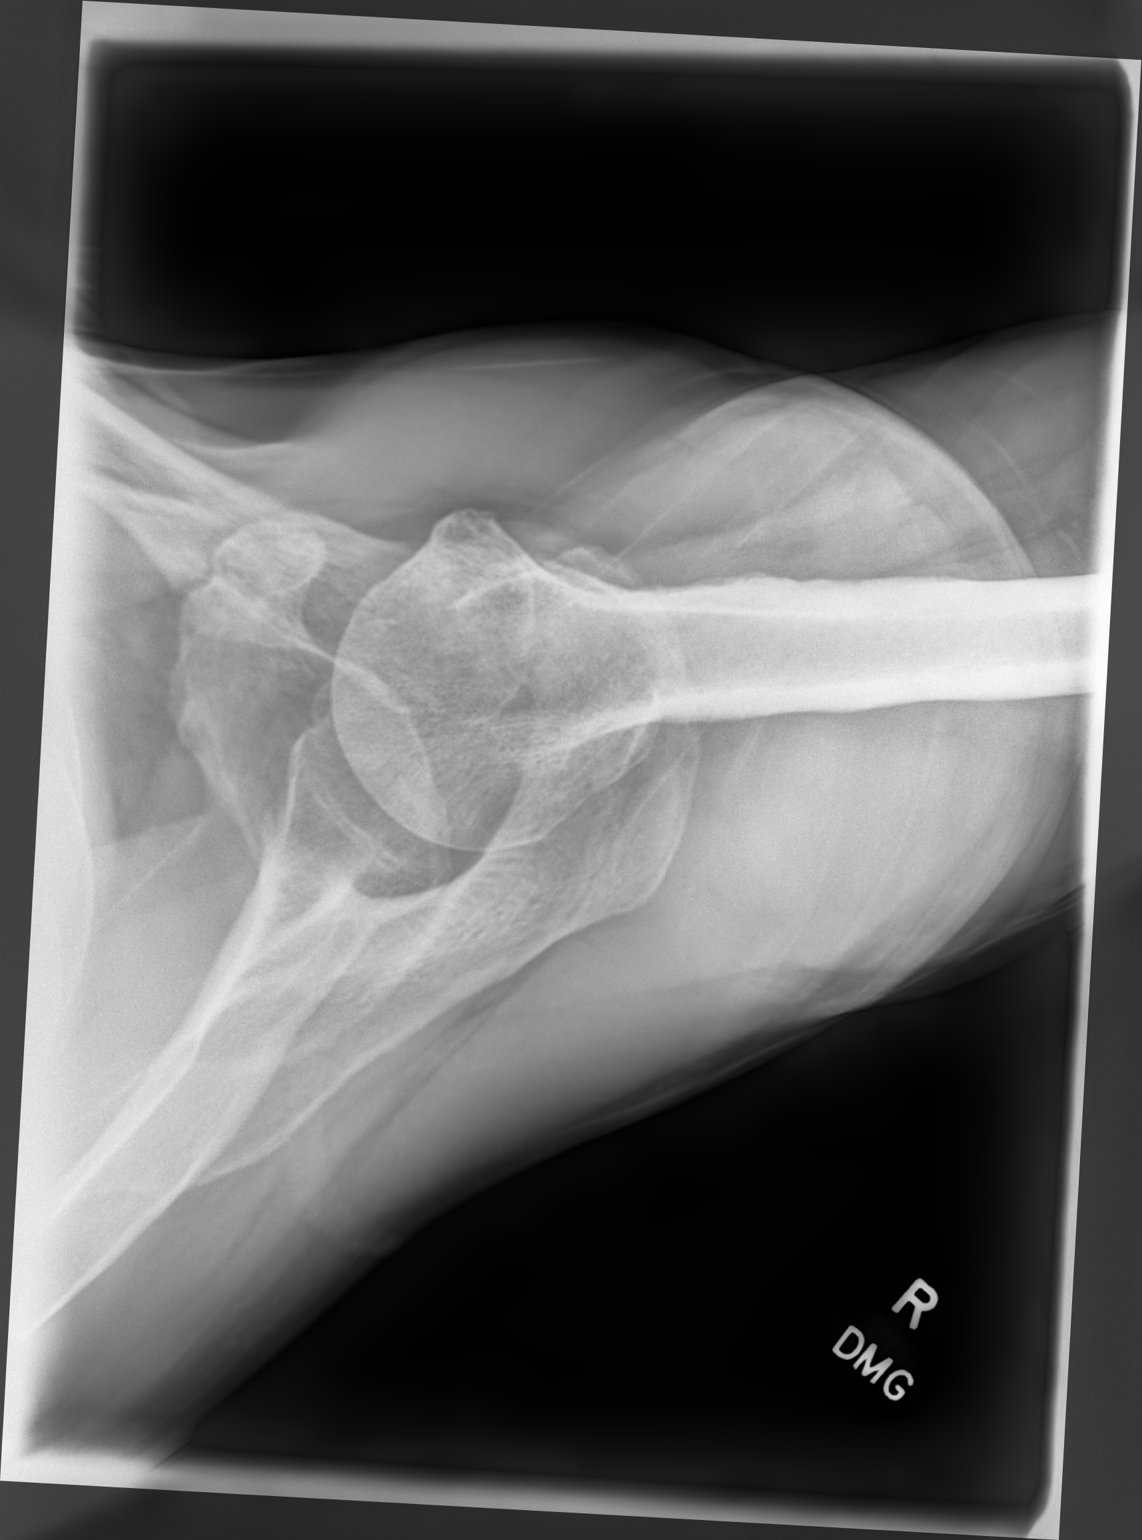

[3 of 3 positions shown; findings below may reference images not displayed]

FINDINGS: Mild degenerative changes of the acromioclavicular joint are noted.
No acute fracture or dislocation is seen. The underlying bony thorax
is within normal limits.
IMPRESSION: No acute abnormality noted.

## 2019-03-19 ENCOUNTER — Other Ambulatory Visit: Payer: Self-pay | Admitting: Family Medicine

## 2019-03-19 DIAGNOSIS — E7800 Pure hypercholesterolemia, unspecified: Secondary | ICD-10-CM

## 2019-03-19 DIAGNOSIS — E119 Type 2 diabetes mellitus without complications: Secondary | ICD-10-CM

## 2019-03-19 NOTE — Telephone Encounter (Signed)
Attempted to reach pt, phone just rang a fast busy signal. Pt has not been seen in a year. He needs an office visit

## 2019-03-24 NOTE — Telephone Encounter (Signed)
Attempted to reach pt, phone just rang a fast busy signal

## 2019-04-13 ENCOUNTER — Other Ambulatory Visit: Payer: Self-pay | Admitting: Family Medicine

## 2019-04-13 DIAGNOSIS — I10 Essential (primary) hypertension: Secondary | ICD-10-CM

## 2019-04-23 ENCOUNTER — Other Ambulatory Visit: Payer: Self-pay | Admitting: Family Medicine

## 2019-04-23 DIAGNOSIS — E7800 Pure hypercholesterolemia, unspecified: Secondary | ICD-10-CM

## 2019-04-23 NOTE — Telephone Encounter (Signed)
Called pt to schedule appointment and let him know that his Rx was called in, no answer LMTCB

## 2019-05-13 ENCOUNTER — Telehealth: Payer: Self-pay | Admitting: Family Medicine

## 2019-05-13 NOTE — Telephone Encounter (Signed)
Patient is calling and requesting a refill for Lisinopril sent to [facility]. CB is [phone]

## 2019-05-13 NOTE — Telephone Encounter (Signed)
Called pt to schedule an appointment and to inform that Rx have been refill. No answer LMTCB, will file message over 2 weeks with no response from patient.

## 2019-05-15 NOTE — Telephone Encounter (Signed)
Patient is calling to check the status of medication. CB is [phone]

## 2019-05-18 NOTE — Telephone Encounter (Signed)
Pt needing to come in for follow up on medications. Dr. Just refilled a Rx we have tried to reach him to schedule an appointment left several messages with no return call only calling for refills. Next time pt call would you see if he will schedule an appointment next available slot.

## 2019-05-27 ENCOUNTER — Other Ambulatory Visit: Payer: Self-pay

## 2019-05-27 DIAGNOSIS — I10 Essential (primary) hypertension: Secondary | ICD-10-CM

## 2019-05-27 MED ORDER — LISINOPRIL 40 MG PO TABS: 40.0000 mg | ORAL_TABLET | Freq: Every day | ORAL | 0 refills | Status: DC

## 2019-05-27 NOTE — Telephone Encounter (Signed)
Pt called back and asked if he can get about a weeks worth to hold him over until his appointment since he is out and he said he has been getting a headache form his bp being elevated. His best number is [phone]

## 2019-05-27 NOTE — Telephone Encounter (Signed)
Rx sent in, tried to call patient to inform him that the requested Rx have been sent to the pharmacy, no answer unable to LM.

## 2019-05-27 NOTE — Telephone Encounter (Signed)
Please just send him in a few weeks of med. Lets remember totalk to him about his phone situation.

## 2019-06-04 ENCOUNTER — Other Ambulatory Visit: Payer: Self-pay

## 2019-06-05 ENCOUNTER — Ambulatory Visit (INDEPENDENT_AMBULATORY_CARE_PROVIDER_SITE_OTHER): Payer: 59 | Admitting: Family Medicine

## 2019-06-05 ENCOUNTER — Encounter: Payer: Self-pay | Admitting: Family Medicine

## 2019-06-05 VITALS — BP 152/76 | HR 76 | Temp 98.1°F | Ht 68.0 in

## 2019-06-05 DIAGNOSIS — R748 Abnormal levels of other serum enzymes: Secondary | ICD-10-CM

## 2019-06-05 DIAGNOSIS — Z789 Other specified health status: Secondary | ICD-10-CM

## 2019-06-05 DIAGNOSIS — I10 Essential (primary) hypertension: Secondary | ICD-10-CM | POA: Diagnosis not present

## 2019-06-05 DIAGNOSIS — Z7289 Other problems related to lifestyle: Secondary | ICD-10-CM | POA: Diagnosis not present

## 2019-06-05 DIAGNOSIS — I491 Atrial premature depolarization: Secondary | ICD-10-CM | POA: Diagnosis not present

## 2019-06-05 DIAGNOSIS — E119 Type 2 diabetes mellitus without complications: Secondary | ICD-10-CM | POA: Diagnosis not present

## 2019-06-05 DIAGNOSIS — E7800 Pure hypercholesterolemia, unspecified: Secondary | ICD-10-CM

## 2019-06-05 DIAGNOSIS — I482 Chronic atrial fibrillation, unspecified: Secondary | ICD-10-CM | POA: Insufficient documentation

## 2019-06-05 MED ORDER — ATORVASTATIN CALCIUM 40 MG PO TABS: ORAL_TABLET | ORAL | 0 refills | Status: DC

## 2019-06-05 MED ORDER — LISINOPRIL 40 MG PO TABS: 40.0000 mg | ORAL_TABLET | Freq: Every day | ORAL | 0 refills | Status: DC

## 2019-06-05 MED ORDER — METFORMIN HCL ER 500 MG PO TB24: ORAL_TABLET | ORAL | 0 refills | Status: DC

## 2019-06-05 NOTE — Progress Notes (Addendum)
Established Patient Office Visit  Subjective:  Patient ID: Elijah Goodman, male    DOB: 1955/10/27  Age: 63 y.o. MRN: [id number]  CC:  Chief Complaint  Patient presents with  . Follow-up    Refill/follow up on medications. No concerns.     HPI Elijah Goodman presents for follow-up of his hypertension, diabetes, elevated cholesterol, elevated liver enzymes, alcohol use and marijuana use.  He has been lost to follow-up for over the last year.  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  Continues to work about 14 hours a day.  Continues to drink 18 beers weekly and smokes marijuana.  Denies chest pain shortness of breath, tobacco use.  History reviewed. No pertinent past medical history.  History reviewed. No pertinent surgical history.  History reviewed. No pertinent family history.  Social History   Socioeconomic History  . Marital status: Married    Spouse name: Not on file  . Number of children: Not on file  . Years of education: Not on file  . Highest education level: Not on file  Occupational History  . Not on file  Tobacco Use  . Smoking status: Never Smoker  . Smokeless tobacco: Never Used  Substance and Sexual Activity  . Alcohol use: Yes    Alcohol/week: 18.0 standard drinks    Types: 18 Cans of beer per week  . Drug use: Yes    Types: Marijuana  . Sexual activity: Not on file  Other Topics Concern  . Not on file  Social History Narrative  . Not on file   Social Determinants of Health   Financial Resource Strain:   . Difficulty of Paying Living Expenses: Not on file  Food Insecurity:   . Worried About Charity fundraiser in the Last Year: Not on file  . Ran Out of Food in the Last Year: Not on file  Transportation Needs:   . Lack of Transportation (Medical): Not on file  . Lack of Transportation (Non-Medical): Not on file  Physical Activity:   . Days of Exercise per Week: Not on file  . Minutes of Exercise per Session: Not on file   Stress:   . Feeling of Stress : Not on file  Social Connections:   . Frequency of Communication with Friends and Family: Not on file  . Frequency of Social Gatherings with Friends and Family: Not on file  . Attends Religious Services: Not on file  . Active Member of Clubs or Organizations: Not on file  . Attends Archivist Meetings: Not on file  . Marital Status: Not on file  Intimate Partner Violence:   . Fear of Current or Ex-Partner: Not on file  . Emotionally Abused: Not on file  . Physically Abused: Not on file  . Sexually Abused: Not on file    Outpatient Medications Prior to Visit  Medication Sig Dispense Refill  . atorvastatin (LIPITOR) 40 MG tablet TAKE 1 TABLET BY MOUTH EVERY DAY FOR CHOLESTEROL 30 tablet 0  . lisinopril (ZESTRIL) 40 MG tablet Take 1 tablet (40 mg total) by mouth daily. 30 tablet 0  . metFORMIN (GLUCOPHAGE-XR) 500 MG 24 hr tablet TAKE 1 TABLET BY MOUTH EACH NIGHT AT BEDTIME 30 tablet 0  . diclofenac sodium (VOLTAREN) 1 % GEL Apply a nickel sized amount to right shoulder 4 times daily as needed for pain. (Patient not taking: Reported on 11/04/2017) 100 g 0   Facility-Administered Medications Prior to Visit  Medication Dose  Route Frequency Provider Last Rate Last Admin  . methylPREDNISolone acetate (DEPO-MEDROL) injection 40 mg  40 mg Intramuscular Once Luetta Nutting, DO        No Known Allergies  ROS Review of Systems  Constitutional: Negative.   HENT: Negative.   Respiratory: Negative.  Negative for chest tightness, shortness of breath and wheezing.   Cardiovascular: Negative for chest pain and palpitations.  Gastrointestinal: Negative.   Endocrine: Negative for polyphagia and polyuria.  Genitourinary: Negative.   Allergic/Immunologic: Negative for immunocompromised state.  Neurological: Negative for light-headedness and numbness.  Hematological: Does not bruise/bleed easily.  Psychiatric/Behavioral: Negative.       Objective:     Physical Exam  Constitutional: He is oriented to person, place, and time. He appears well-developed and well-nourished. No distress.  HENT:  Head: Normocephalic and atraumatic.  Right Ear: External ear normal.  Left Ear: External ear normal.  Eyes: Pupils are equal, round, and reactive to light. Conjunctivae are normal.  Neck: No JVD present. No tracheal deviation present. No thyromegaly present.  Cardiovascular: Normal rate. An irregular rhythm present.  Pulmonary/Chest: Effort normal and breath sounds normal. No stridor.  Abdominal: Normal appearance and bowel sounds are normal.  Musculoskeletal:     Cervical back: Neck supple.  Lymphadenopathy:    He has no cervical adenopathy.  Neurological: He is alert and oriented to person, place, and time.  Skin: He is not diaphoretic.  Psychiatric: He has a normal mood and affect. His speech is normal and behavior is normal.    BP (!) 152/76   Pulse 76   Temp 98.1 F (36.7 C) (Tympanic)   Ht 5\' 8"  (1.727 m)   SpO2 97%   BMI 23.75 kg/m  Wt Readings from Last 3 Encounters:  11/04/17 156 lb 3.2 oz (70.9 kg)  09/17/17 156 lb 6 oz (70.9 kg)  06/28/17 160 lb 6 oz (72.7 kg)     Health Maintenance Due  Topic Date Due  . Hepatitis C Screening  27-Nov-1955  . PNEUMOCOCCAL POLYSACCHARIDE VACCINE AGE 69-64 HIGH RISK  09/26/1957  . FOOT EXAM  09/26/1965  . OPHTHALMOLOGY EXAM  09/26/1965  . HIV Screening  09/27/1970  . TETANUS/TDAP  09/27/1974  . COLONOSCOPY  09/26/2005  . HEMOGLOBIN A1C  12/29/2017    There are no preventive care reminders to display for this patient.  No results found for: TSH Lab Results  Component Value Date   WBC 6.4 06/28/2017   HGB 13.8 06/28/2017   HCT 42.2 06/28/2017   MCV 81.8 06/28/2017   PLT 165.0 06/28/2017   Lab Results  Component Value Date   NA 138 09/17/2017   K 3.6 09/17/2017   CO2 27 09/17/2017   GLUCOSE 95 09/17/2017   BUN 17 09/17/2017   CREATININE 0.79 09/17/2017   BILITOT 0.3  09/17/2017   ALKPHOS 70 09/17/2017   AST 20 09/17/2017   ALT 38 09/17/2017   PROT 6.5 09/17/2017   ALBUMIN 4.1 09/17/2017   CALCIUM 10.0 09/17/2017   GFR 105.64 09/17/2017   Lab Results  Component Value Date   CHOL 116 06/28/2017   Lab Results  Component Value Date   HDL 60.10 06/28/2017   Lab Results  Component Value Date   LDLCALC 44 06/28/2017   Lab Results  Component Value Date   TRIG 60.0 06/28/2017   Lab Results  Component Value Date   CHOLHDL 2 06/28/2017   Lab Results  Component Value Date   HGBA1C 6.3 06/28/2017  Assessment & Plan:   Problem List Items Addressed This Visit      Cardiovascular and Mediastinum   Essential hypertension   Relevant Medications   atorvastatin (LIPITOR) 40 MG tablet   lisinopril (ZESTRIL) 40 MG tablet   Other Relevant Orders   Urinalysis, Routine w reflex microscopic   CBC   Basic metabolic panel   PAC (premature atrial contraction)   Relevant Medications   atorvastatin (LIPITOR) 40 MG tablet   lisinopril (ZESTRIL) 40 MG tablet   Other Relevant Orders   EKG 12-Lead (Completed)     Endocrine   Type 2 diabetes mellitus without complication, without long-term current use of insulin (HCC)   Relevant Medications   atorvastatin (LIPITOR) 40 MG tablet   metFORMIN (GLUCOPHAGE-XR) 500 MG 24 hr tablet   lisinopril (ZESTRIL) 40 MG tablet   Other Relevant Orders   Urinalysis, Routine w reflex microscopic   Hemoglobin [id number]   Basic metabolic panel     Other   Elevated LDL cholesterol level   Relevant Medications   atorvastatin (LIPITOR) 40 MG tablet   Other Relevant Orders   LDL cholesterol, direct   Alcohol use - Primary   Relevant Orders   Urine drugs of abuse scrn w alc, routine ([facility])   Elevated liver enzymes   Relevant Orders   Hepatic function panel   Gamma GT      Meds ordered this encounter  Medications  . atorvastatin (LIPITOR) 40 MG tablet    Sig: TAKE 1 TABLET BY MOUTH  EVERY DAY FOR CHOLESTEROL    Dispense:  90 tablet    Refill:  0  . metFORMIN (GLUCOPHAGE-XR) 500 MG 24 hr tablet    Sig: TAKE 1 TABLET BY MOUTH EACH NIGHT AT BEDTIME    Dispense:  90 tablet    Refill:  0    Patient needs appt.  Marland Kitchen lisinopril (ZESTRIL) 40 MG tablet    Sig: Take 1 tablet (40 mg total) by mouth daily.    Dispense:  90 tablet    Refill:  0    Needs appt    Follow-up: Return in about 1 month (around 07/06/2019).   We had a long discussion about patient's alcohol abuse and his blood pressure and today's diagnosis of premature atrial contractions.  Asked him to decrease his alcohol to no more than 1 beer daily.  He said that he would try.  EKG showed premature atrial beats.  Libby Maw, MD

## 2019-06-05 NOTE — Patient Instructions (Signed)
Alcohol Abuse and Dependence Information, Adult Alcohol is a widely available drug. People drink alcohol in different amounts. People who drink alcohol very often and in large amounts often have problems during and after drinking. They may develop what is called an alcohol use disorder. There are two main types of alcohol use disorders:  Alcohol abuse. This is when you use alcohol too much or too often. You may use alcohol to make yourself feel happy or to reduce stress. You may have a hard time setting a limit on the amount you drink.  Alcohol dependence. This is when you use alcohol consistently for a period of time, and your body changes as a result. This can make it hard to stop drinking because you may start to feel sick or feel different when you do not use alcohol. These symptoms are known as withdrawal. How can alcohol abuse and dependence affect me? Alcohol abuse and dependence can have a negative effect on your life. Drinking too much can lead to addiction. You may feel like you need alcohol to function normally. You may drink alcohol before work in the morning, during the day, or as soon as you get home from work in the evening. These actions can result in:  Poor work performance.  Job loss.  Financial problems.  Car crashes or criminal charges from driving after drinking alcohol.  Problems in your relationships with friends and family.  Losing the trust and respect of coworkers, friends, and family. Drinking heavily over a long period of time can permanently damage your body and brain, and can cause lifelong health issues, such as:  Damage to your liver or pancreas.  Heart problems, high blood pressure, or stroke.  Certain cancers.  Decreased ability to fight infections.  Brain or nerve damage.  Depression.  Early (premature) death. If you are careless or you crave alcohol, it is easy to drink more than your body can handle (overdose). Alcohol overdose is a serious  situation that requires hospitalization. It may lead to permanent injuries or death. What can increase my risk?  Having a family history of alcohol abuse.  Having depression or other mental health conditions.  Beginning to drink at an early age.  Binge drinking often.  Experiencing trauma, stress, and an unstable home life during childhood.  Spending time with people who drink often. What actions can I take to prevent or manage alcohol abuse and dependence?  Do not drink alcohol if: ? Your health care provider tells you not to drink. ? You are pregnant, may be pregnant, or are planning to become pregnant.  If you drink alcohol: ? Limit how much you use to:  0-1 drink a day for women.  0-2 drinks a day for men. ? Be aware of how much alcohol is in your drink. In the [place] one drink equals one 12 oz bottle of beer (355 mL), one 5 oz glass of wine (148 mL), or one 1 oz glass of hard liquor (44 mL).  Stop drinking if you have been drinking too much. This can be very hard to do if you are used to abusing alcohol. If you begin to have withdrawal symptoms, talk with your health care provider or a person that you trust. These symptoms may include anxiety, shaky hands, headache, nausea, sweating, or not being able to sleep.  Choose to drink nonalcoholic beverages in social gatherings and places where there may be alcohol. Activity  Spend more time on activities that you enjoy that do   not involve alcohol, like hobbies or exercise.  Find healthy ways to cope with stress, such as exercise, meditation, or spending time with people you care about. General information  Talk to your family, coworkers, and friends about supporting you in your efforts to stop drinking. If they drink, ask them not to drink around you. Spend more time with people who do not drink alcohol.  If you think that you have an alcohol dependency problem: ? Tell friends or family about your concerns. ? Talk with your  health care provider or another health professional about where to get help. ? Work with a Transport planner and a Regulatory affairs officer. ? Consider joining a support group for people who struggle with alcohol abuse and dependence. Where to find support   Your health care provider.  SMART Recovery: www.smartrecovery.org Therapy and support groups  Local treatment centers or chemical dependency counselors.  Local AA groups in your community: NicTax.com.pt Where to find more information  Centers for Disease Control and Prevention: http://www.wolf.info/  National Institute on Alcohol Abuse and Alcoholism: http://www.bradshaw.com/  Alcoholics Anonymous (AA): NicTax.com.pt Contact a health care provider if:  You drank more or for longer than you intended on more than one occasion.  You tried to stop drinking or to cut back on how much you drink, but you were not able to.  You often drink to the point of vomiting or passing out.  You want to drink so badly that you cannot think about anything else.  You have problems in your life due to drinking, but you continue to drink.  You keep drinking even though you feel anxious, depressed, or have experienced memory loss.  You have stopped doing the things you used to enjoy in order to drink.  You have to drink more than you used to in order to get the effect you want.  You experience anxiety, sweating, nausea, shakiness, and trouble sleeping when you try to stop drinking. Get help right away if:  You have thoughts about hurting yourself or others.  You have serious withdrawal symptoms, including: ? Confusion. ? Racing heart. ? High blood pressure. ? Fever. If you ever feel like you may hurt yourself or others, or have thoughts about taking your own life, get help right away. You can go to your nearest emergency department or call:  Your local emergency services (911 in the [place]  A suicide crisis helpline, such as the [facility] at (216) 439-9475. This is open 24 hours a day. Summary  Alcohol abuse and dependence can have a negative effect on your life. Drinking too much or too often can lead to addiction.  If you drink alcohol, limit how much you use.  If you are having trouble keeping your drinking under control, find ways to change your behavior. Hobbies, calming activities, exercise, or support groups can help.  If you feel you need help with changing your drinking habits, talk with your health care provider, a good friend, or a therapist, or go to an [facility] group. This information is not intended to replace advice given to you by your health care provider. Make sure you discuss any questions you have with your health care provider. Document Revised: 07/08/2018 Document Reviewed: 05/27/2018 Elsevier Patient Education  [place].  Premature Atrial Contraction  A premature atrial contraction [facility]) is a kind of irregular heartbeat (arrhythmia). It happens when the heart beats too early and then pauses before beating again. The heart has four areas, or  chambers. Normally, electrical signals spread across the heart and make all the chambers beat together. During a PAC, the upper chambers of the heart (atria) beat too early, before they have had time to fill with blood. The heartbeat pauses afterward so the heart can fill with blood for the next beat. Sometimes PAC can be a warning sign of another type of arrhythmia called atrial fibrillation. Atrial fibrillation may allow blood to pool in the atria and form clots. If a clot travels to the brain, it can cause a stroke. What are the causes? The cause of this condition is often unknown. Sometimes, this condition may be caused by heart disease or injury to the heart. What increases the risk? You are more likely to develop this condition if:  You are a child.  You are an adult who is 45 years of age or older. Episodes may be triggered  by:  Caffeine.  Alcohol.  Tobacco use.  Stimulant drugs.  Some medicines or supplements.  Stress.  Heart disease. What are the signs or symptoms? Symptoms of this condition include:  A feeling that your heart skipped a beat. The first heartbeat after the "skipped" beat may feel more forceful.  A feeling that your heart is fluttering. How is this diagnosed? This condition is diagnosed based on:  Your symptoms.  A physical exam. Your health care provider may listen to your heart.  An electrocardiogram (ECG). This is a test that records the electrical impulses of the heart.  An ambulatory cardiac monitor. This device records your heartbeats for 24 hours or more. You may also have:  An echocardiogram to check for any heart conditions. This is a type of imaging test that uses sound waves (ultrasound) to make images of your heart.  Blood tests. How is this treated? Treatment depends on the frequency of your symptoms and other risk factors. Treatments may include:  Medicines (beta-blockers).  Catheter ablation. This is done to destroy the part of the heart tissue that sends abnormal signals. In some cases, treatment may not be needed for this condition. Follow these instructions at home: Lifestyle  Do not use any products that contain nicotine or tobacco, such as cigarettes, e-cigarettes, and chewing tobacco. If you need help quitting, ask your health care provider.  Exercise regularly. Ask your health care provider what type of exercise is safe for you.  Find healthy ways to manage stress.  Try to get at least 7-9 hours of sleep each night, or as much as recommended by your health care provider. Alcohol use  Do not drink alcohol if: ? Your health care provider tells you not to drink. ? You are pregnant, may be pregnant, or are planning to become pregnant. ? Alcohol triggers your episodes.  If you drink alcohol: ? Limit how much you use to:  0-1 drink a day for  women.  0-2 drinks a day for men. ? Be aware of how much alcohol is in your drink. In the [place] one drink equals one 12 oz bottle of beer (355 mL), one 5 oz glass of wine (148 mL), or one 1 oz glass of hard liquor (44 mL). General instructions  Take over-the-counter and prescription medicines only as told by your health care provider.  If caffeine triggers episodes, do not eat, drink, or use anything with caffeine in it.  Keep all follow-up visits as told by your health care provider. This is important. Contact a health care provider if:  You feel your heart skipping  beats.  Your heart skips beats and you feel dizzy, light-headed, or very tired. Get help right away if you have:  Chest pain.  Trouble breathing.  Any symptoms of a stroke. "BE FAST" is an easy way to remember the main warning signs of a stroke. ? B - Balance. Signs are dizziness, sudden trouble walking, or loss of balance. ? E - Eyes. Signs are trouble seeing or a sudden change in vision. ? F - Face. Signs are sudden weakness or numbness of the face, or the face or eyelid drooping on one side. ? A - Arms. Signs are weakness or numbness in an arm. This happens suddenly and usually on one side of the body. ? S - Speech. Signs are sudden trouble speaking, slurred speech, or trouble understanding what people say. ? T - Time. Time to call emergency services. Write down what time symptoms started.  Other signs of stroke, such as: ? A sudden, severe headache with no known cause. ? Nausea or vomiting. ? Seizure. These symptoms may represent a serious problem that is an emergency. Do not wait to see if the symptoms will go away. Get medical help right away. Call your local emergency services (911 in the [place] Do not drive yourself to the hospital. Summary  A premature atrial contraction [facility]) is a kind of irregular heartbeat (arrhythmia). It happens when the heart beats too early and then pauses before beating  again.  Treatment depends on your symptoms and whether you have other underlying heart conditions.  Contact a health care provider if your heart skips beats and you feel dizzy, light-headed, or very tired.  In some cases, this condition may lead to a stroke. "BE FAST" is an easy way to remember the warning signs of stroke. Get help right away if you have any of the "BE FAST" signs. This information is not intended to replace advice given to you by your health care provider. Make sure you discuss any questions you have with your health care provider. Document Revised: 12/12/2017 Document Reviewed: 12/12/2017 Elsevier Patient Education  2020 [employer].

## 2019-06-08 ENCOUNTER — Telehealth: Payer: Self-pay

## 2019-06-08 LAB — BASIC METABOLIC PANEL

## 2019-06-08 LAB — URINALYSIS, ROUTINE W REFLEX MICROSCOPIC
Bacteria, UA: NONE SEEN /HPF
Bilirubin Urine: NEGATIVE
Glucose, UA: NEGATIVE
Hgb urine dipstick: NEGATIVE
Hyaline Cast: NONE SEEN /LPF
Ketones, ur: NEGATIVE
Leukocytes,Ua: NEGATIVE
Nitrite: NEGATIVE
RBC / HPF: NONE SEEN /HPF (ref 0–2)
Specific Gravity, Urine: 1.022 (ref 1.001–1.03)
pH: <=5 (ref 5.0–8.0)

## 2019-06-08 LAB — CBC

## 2019-06-08 LAB — HEMOGLOBIN A1C

## 2019-06-08 LAB — LDL CHOLESTEROL, DIRECT

## 2019-06-08 NOTE — Addendum Note (Signed)
Addended by: Lynnea Ferrier on: 06/08/2019 08:30 AM   Modules accepted: Orders

## 2019-06-08 NOTE — Telephone Encounter (Signed)
Pt's labs from 06/05/19 will need to be repeated due to a power loss. His tubes of blood were locked in the centrifuge and we were unable to get them out.  I called patient today and he was very understanding and stated he would return this week to repeat his lab work.

## 2019-06-12 LAB — URINE DRUGS OF ABUSE SCREEN W ALC, ROUTINE (REF LAB)
Barbiturate Quant, Ur: NEGATIVE ng/mL
Benzodiazepine Quant, Ur: NEGATIVE ng/mL
Cocaine (Metab.): NEGATIVE ng/mL
Ethanol, Urine: NEGATIVE %
Methadone Screen, Urine: NEGATIVE ng/mL
Opiate Quant, Ur: NEGATIVE ng/mL
PCP Quant, Ur: NEGATIVE ng/mL
Propoxyphene: NEGATIVE ng/mL

## 2019-06-12 LAB — AMPHETAMINE CONF, UR
Amphetamine GC/MS Conf: >3000 ng/mL
Amphetamine: POSITIVE — AB
Amphetamines: POSITIVE — AB
Methamphetamine Quant, Ur: >3000 ng/mL
Methamphetamine: POSITIVE — AB

## 2019-06-12 LAB — PANEL 799049
CARBOXY THC GC/MS CONF: 161 ng/mL
Cannabinoid GC/MS, Ur: POSITIVE — AB

## 2019-06-22 ENCOUNTER — Other Ambulatory Visit: Payer: 59

## 2019-06-22 ENCOUNTER — Other Ambulatory Visit (INDEPENDENT_AMBULATORY_CARE_PROVIDER_SITE_OTHER): Payer: 59

## 2019-06-22 ENCOUNTER — Other Ambulatory Visit: Payer: Self-pay

## 2019-06-22 DIAGNOSIS — Z7289 Other problems related to lifestyle: Secondary | ICD-10-CM | POA: Diagnosis not present

## 2019-06-22 DIAGNOSIS — I491 Atrial premature depolarization: Secondary | ICD-10-CM

## 2019-06-22 DIAGNOSIS — Z789 Other specified health status: Secondary | ICD-10-CM

## 2019-06-22 DIAGNOSIS — E7800 Pure hypercholesterolemia, unspecified: Secondary | ICD-10-CM | POA: Diagnosis not present

## 2019-06-22 DIAGNOSIS — R748 Abnormal levels of other serum enzymes: Secondary | ICD-10-CM

## 2019-06-22 DIAGNOSIS — E119 Type 2 diabetes mellitus without complications: Secondary | ICD-10-CM

## 2019-06-22 DIAGNOSIS — I10 Essential (primary) hypertension: Secondary | ICD-10-CM

## 2019-06-22 DIAGNOSIS — F1090 Alcohol use, unspecified, uncomplicated: Secondary | ICD-10-CM

## 2019-06-22 LAB — GAMMA GT: GGT: 16 U/L (ref 7–51)

## 2019-06-22 LAB — CBC
HCT: 41.6 % (ref 39.0–52.0)
Hemoglobin: 13.6 g/dL (ref 13.0–17.0)
MCHC: 32.7 g/dL (ref 30.0–36.0)
MCV: 81.3 fl (ref 78.0–100.0)
Platelets: 165 10*3/uL (ref 150.0–400.0)
RBC: 5.12 Mil/uL (ref 4.22–5.81)
RDW: 13.6 % (ref 11.5–15.5)
WBC: 7.4 10*3/uL (ref 4.0–10.5)

## 2019-06-22 LAB — HEPATIC FUNCTION PANEL
ALT: 32 U/L (ref 0–53)
AST: 22 U/L (ref 0–37)
Albumin: 4 g/dL (ref 3.5–5.2)
Alkaline Phosphatase: 84 U/L (ref 39–117)
Bilirubin, Direct: 0.1 mg/dL (ref 0.0–0.3)
Total Bilirubin: 0.4 mg/dL (ref 0.2–1.2)
Total Protein: 6.8 g/dL (ref 6.0–8.3)

## 2019-06-22 LAB — BASIC METABOLIC PANEL
BUN: 20 mg/dL (ref 6–23)
CO2: 29 mEq/L (ref 19–32)
Calcium: 9.8 mg/dL (ref 8.4–10.5)
Chloride: 102 mEq/L (ref 96–112)
Creatinine, Ser: 1.05 mg/dL (ref 0.40–1.50)
GFR: 71.17 mL/min (ref >60.00)
Glucose, Bld: 173 mg/dL — ABNORMAL HIGH (ref 70–99)
Potassium: 3.7 mEq/L (ref 3.5–5.1)
Sodium: 138 mEq/L (ref 135–145)

## 2019-06-22 LAB — HEMOGLOBIN A1C: Hgb A1c MFr Bld: 6.6 % — ABNORMAL HIGH (ref 4.6–6.5)

## 2019-06-22 LAB — LDL CHOLESTEROL, DIRECT: Direct LDL: 63 mg/dL

## 2019-06-29 ENCOUNTER — Other Ambulatory Visit: Payer: Self-pay | Admitting: Family Medicine

## 2019-06-29 DIAGNOSIS — I10 Essential (primary) hypertension: Secondary | ICD-10-CM

## 2019-06-29 NOTE — Telephone Encounter (Signed)
Last fill 06/05/19  #90/0

## 2019-07-06 ENCOUNTER — Telehealth: Payer: Self-pay | Admitting: Family Medicine

## 2019-07-06 NOTE — Telephone Encounter (Signed)
Spoke with patient who verbally understood doctor can not send in antibiotics without office visit. Advised patient to call his dentist since he is having issues with his teeth so they will be able to assist him better. Patient agrees and will contact dentist.

## 2019-07-06 NOTE — Telephone Encounter (Signed)
Patient is calling and stated that he is experiencing some pain in a tooth that has some swelling and wanted to see if some antibiotics can be sent [facility]. CB is [phone]

## 2019-09-29 ENCOUNTER — Other Ambulatory Visit: Payer: Self-pay | Admitting: Family Medicine

## 2019-09-29 DIAGNOSIS — E7800 Pure hypercholesterolemia, unspecified: Secondary | ICD-10-CM

## 2019-09-29 DIAGNOSIS — I10 Essential (primary) hypertension: Secondary | ICD-10-CM

## 2019-11-11 ENCOUNTER — Other Ambulatory Visit: Payer: Self-pay | Admitting: Family Medicine

## 2019-11-11 DIAGNOSIS — E7800 Pure hypercholesterolemia, unspecified: Secondary | ICD-10-CM

## 2019-11-11 DIAGNOSIS — I10 Essential (primary) hypertension: Secondary | ICD-10-CM

## 2019-11-30 ENCOUNTER — Other Ambulatory Visit: Payer: Self-pay | Admitting: Family Medicine

## 2019-11-30 DIAGNOSIS — E119 Type 2 diabetes mellitus without complications: Secondary | ICD-10-CM

## 2019-12-22 NOTE — Telephone Encounter (Signed)
Called patient to schedule appointment no answer unable to LM

## 2020-01-01 ENCOUNTER — Telehealth: Payer: Self-pay

## 2020-01-01 ENCOUNTER — Other Ambulatory Visit: Payer: Self-pay | Admitting: Family Medicine

## 2020-01-01 DIAGNOSIS — I10 Essential (primary) hypertension: Secondary | ICD-10-CM

## 2020-01-01 NOTE — Telephone Encounter (Signed)
Received a refill  Request for the Zestril 40 mg,  Patient overdue for OV, called and scheduled him an appt for 01/14/20 @ 1:00 pm then sent a 30 day RX to the pharmacy.  Pt agreeable to appt. Dm/cma

## 2020-01-14 ENCOUNTER — Ambulatory Visit: Payer: 59 | Admitting: Family Medicine

## 2020-01-28 ENCOUNTER — Other Ambulatory Visit: Payer: Self-pay

## 2020-01-28 ENCOUNTER — Ambulatory Visit: Payer: 59 | Admitting: Family Medicine

## 2020-01-28 ENCOUNTER — Encounter: Payer: Self-pay | Admitting: Family Medicine

## 2020-01-28 VITALS — BP 148/76 | HR 69 | Temp 96.9°F | Ht 68.0 in | Wt 154.2 lb

## 2020-01-28 DIAGNOSIS — Z7289 Other problems related to lifestyle: Secondary | ICD-10-CM | POA: Diagnosis not present

## 2020-01-28 DIAGNOSIS — E7800 Pure hypercholesterolemia, unspecified: Secondary | ICD-10-CM

## 2020-01-28 DIAGNOSIS — I10 Essential (primary) hypertension: Secondary | ICD-10-CM

## 2020-01-28 DIAGNOSIS — R748 Abnormal levels of other serum enzymes: Secondary | ICD-10-CM

## 2020-01-28 DIAGNOSIS — Z8546 Personal history of malignant neoplasm of prostate: Secondary | ICD-10-CM | POA: Diagnosis not present

## 2020-01-28 DIAGNOSIS — E119 Type 2 diabetes mellitus without complications: Secondary | ICD-10-CM | POA: Diagnosis not present

## 2020-01-28 DIAGNOSIS — Z789 Other specified health status: Secondary | ICD-10-CM

## 2020-01-28 DIAGNOSIS — R7989 Other specified abnormal findings of blood chemistry: Secondary | ICD-10-CM | POA: Diagnosis not present

## 2020-01-28 MED ORDER — METFORMIN HCL ER 500 MG PO TB24: ORAL_TABLET | ORAL | 1 refills | Status: DC

## 2020-01-28 MED ORDER — LISINOPRIL 40 MG PO TABS: 40.0000 mg | ORAL_TABLET | Freq: Every day | ORAL | 1 refills | Status: DC

## 2020-01-28 MED ORDER — ATORVASTATIN CALCIUM 40 MG PO TABS: ORAL_TABLET | ORAL | 1 refills | Status: DC

## 2020-01-28 NOTE — Progress Notes (Signed)
Established Patient Office Visit  Subjective:  Patient ID: Elijah Goodman, male    DOB: April 28, 1955  Age: 64 y.o. MRN: [id number]  CC:  Chief Complaint  Patient presents with  . Medication Refill    refill/follow up on medications. No concerns    HPI Elijah Goodman presents for follow-up of hypertension, elevated cholesterol and prediabetes.  He has been out of his medicines for couple weeks now.  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  Continues to drink alcohol.  Drinks 2-3 beers 3-4 times weekly.  Smokes marijuana daily.  Denies use of stimulants cocaine amphetamines or other drugs of abuse.  History of prostate cancer treated with seeding.  Status post follow-up with Dr. Burnell Blanks 8 months ago.  Colonoscopy 2 years ago.  Past Medical History:  Diagnosis Date  . Prostate cancer ([place]) 2019    History reviewed. No pertinent surgical history.  History reviewed. No pertinent family history.  Social History   Socioeconomic History  . Marital status: Married    Spouse name: Not on file  . Number of children: Not on file  . Years of education: Not on file  . Highest education level: Not on file  Occupational History  . Not on file  Tobacco Use  . Smoking status: Never Smoker  . Smokeless tobacco: Never Used  Substance and Sexual Activity  . Alcohol use: Yes    Alcohol/week: 18.0 standard drinks    Types: 18 Cans of beer per week  . Drug use: Yes    Types: Marijuana  . Sexual activity: Not on file  Other Topics Concern  . Not on file  Social History Narrative  . Not on file   Social Determinants of Health   Financial Resource Strain:   . Difficulty of Paying Living Expenses: Not on file  Food Insecurity:   . Worried About Charity fundraiser in the Last Year: Not on file  . Ran Out of Food in the Last Year: Not on file  Transportation Needs:   . Lack of Transportation (Medical): Not on file  . Lack of Transportation (Non-Medical): Not on  file  Physical Activity:   . Days of Exercise per Week: Not on file  . Minutes of Exercise per Session: Not on file  Stress:   . Feeling of Stress : Not on file  Social Connections:   . Frequency of Communication with Friends and Family: Not on file  . Frequency of Social Gatherings with Friends and Family: Not on file  . Attends Religious Services: Not on file  . Active Member of Clubs or Organizations: Not on file  . Attends Archivist Meetings: Not on file  . Marital Status: Not on file  Intimate Partner Violence:   . Fear of Current or Ex-Partner: Not on file  . Emotionally Abused: Not on file  . Physically Abused: Not on file  . Sexually Abused: Not on file    Outpatient Medications Prior to Visit  Medication Sig Dispense Refill  . atorvastatin (LIPITOR) 40 MG tablet TAKE 1 TABLET BY MOUTH EVERY DAY FOR CHOLESTEROL 30 tablet 0  . lisinopril (ZESTRIL) 40 MG tablet TAKE 1 TABLET BY MOUTH EVERY DAY 30 tablet 0  . metFORMIN (GLUCOPHAGE-XR) 500 MG 24 hr tablet TAKE 1 TABLET BY MOUTH EACH NIGHT AT BEDTIME 30 tablet 1  . diclofenac sodium (VOLTAREN) 1 % GEL Apply a nickel sized amount to right shoulder 4 times daily as needed  for pain. (Patient not taking: Reported on 11/04/2017) 100 g 0   Facility-Administered Medications Prior to Visit  Medication Dose Route Frequency Provider Last Rate Last Admin  . methylPREDNISolone acetate (DEPO-MEDROL) injection 40 mg  40 mg Intramuscular Once Luetta Nutting, DO        No Known Allergies  ROS Review of Systems  Constitutional: Negative.  Negative for unexpected weight change.  HENT: Negative.   Eyes: Negative for photophobia and visual disturbance.  Respiratory: Negative for cough, choking and shortness of breath.   Cardiovascular: Negative for chest pain and palpitations.  Gastrointestinal: Negative for abdominal pain, anal bleeding and blood in stool.  Endocrine: Negative for polyphagia and polyuria.  Genitourinary: Negative  for difficulty urinating, frequency and urgency.  Musculoskeletal: Negative for gait problem and joint swelling.  Skin: Negative for pallor and rash.  Allergic/Immunologic: Negative for immunocompromised state.  Neurological: Negative for light-headedness and headaches.  Hematological: Does not bruise/bleed easily.  Psychiatric/Behavioral: Negative for dysphoric mood.      Objective:    Physical Exam Vitals and nursing note reviewed.  Constitutional:      General: He is not in acute distress.    Appearance: Normal appearance. He is normal weight. He is not ill-appearing, toxic-appearing or diaphoretic.  HENT:     Head: Normocephalic and atraumatic.     Right Ear: Tympanic membrane, ear canal and external ear normal. There is no impacted cerumen.     Left Ear: Tympanic membrane, ear canal and external ear normal. There is no impacted cerumen.     Nose: No congestion or rhinorrhea.     Mouth/Throat:     Mouth: Mucous membranes are moist.     Pharynx: Oropharynx is clear. No oropharyngeal exudate or posterior oropharyngeal erythema.  Eyes:     General: No scleral icterus.       Right eye: No discharge.        Left eye: No discharge.     Extraocular Movements: Extraocular movements intact.     Conjunctiva/sclera: Conjunctivae normal.  Cardiovascular:     Rate and Rhythm: Normal rate and regular rhythm.  Pulmonary:     Effort: Pulmonary effort is normal.     Breath sounds: Normal breath sounds.  Abdominal:     General: Abdomen is flat. Bowel sounds are normal. There is no distension.     Palpations: Abdomen is soft. There is no mass.     Tenderness: There is no abdominal tenderness. There is no guarding or rebound.  Musculoskeletal:     Cervical back: Neck supple. No rigidity or tenderness.     Right lower leg: No edema.     Left lower leg: No edema.  Lymphadenopathy:     Cervical: No cervical adenopathy.  Skin:    General: Skin is warm and dry.  Neurological:     Mental  Status: He is alert and oriented to person, place, and time.  Psychiatric:        Mood and Affect: Mood normal.        Behavior: Behavior normal.     BP (!) 148/76   Pulse 69   Temp (!) 96.9 F (36.1 C) (Tympanic)   Ht 5\' 8"  (1.727 m)   Wt 154 lb 3.2 oz (69.9 kg)   SpO2 96%   BMI 23.45 kg/m  Wt Readings from Last 3 Encounters:  01/28/20 154 lb 3.2 oz (69.9 kg)  11/04/17 156 lb 3.2 oz (70.9 kg)  09/17/17 156 lb 6 oz (70.9  kg)     Health Maintenance Due  Topic Date Due  . Hepatitis C Screening  Never done  . FOOT EXAM  Never done  . OPHTHALMOLOGY EXAM  Never done  . HIV Screening  Never done  . TETANUS/TDAP  Never done  . HEMOGLOBIN A1C  12/23/2019    There are no preventive care reminders to display for this patient.  No results found for: TSH Lab Results  Component Value Date   WBC 7.4 06/22/2019   HGB 13.6 06/22/2019   HCT 41.6 06/22/2019   MCV 81.3 06/22/2019   PLT 165.0 06/22/2019   Lab Results  Component Value Date   NA 138 06/22/2019   K 3.7 06/22/2019   CO2 29 06/22/2019   GLUCOSE 173 (H) 06/22/2019   BUN 20 06/22/2019   CREATININE 1.05 06/22/2019   BILITOT 0.4 06/22/2019   ALKPHOS 84 06/22/2019   AST 22 06/22/2019   ALT 32 06/22/2019   PROT 6.8 06/22/2019   ALBUMIN 4.0 06/22/2019   CALCIUM 9.8 06/22/2019   GFR 71.17 06/22/2019   Lab Results  Component Value Date   CHOL 116 06/28/2017   Lab Results  Component Value Date   HDL 60.10 06/28/2017   Lab Results  Component Value Date   LDLCALC 44 06/28/2017   Lab Results  Component Value Date   TRIG 60.0 06/28/2017   Lab Results  Component Value Date   CHOLHDL 2 06/28/2017   Lab Results  Component Value Date   HGBA1C 6.6 (H) 06/22/2019      Assessment & Plan:   Problem List Items Addressed This Visit      Cardiovascular and Mediastinum   Essential hypertension   Relevant Medications   atorvastatin (LIPITOR) 40 MG tablet   lisinopril (ZESTRIL) 40 MG tablet   Other Relevant  Orders   CBC   Urinalysis, Routine w reflex microscopic   Basic metabolic panel   Microalbumin / creatinine urine ratio     Endocrine   Type 2 diabetes mellitus without complication, without long-term current use of insulin (HCC)   Relevant Medications   metFORMIN (GLUCOPHAGE-XR) 500 MG 24 hr tablet   atorvastatin (LIPITOR) 40 MG tablet   lisinopril (ZESTRIL) 40 MG tablet   Other Relevant Orders   Urinalysis, Routine w reflex microscopic   Basic metabolic panel   Hemoglobin A1c   Microalbumin / creatinine urine ratio     Other   Elevated LDL cholesterol level   Relevant Medications   atorvastatin (LIPITOR) 40 MG tablet   Other Relevant Orders   LDL cholesterol, direct   Alcohol use - Primary   History of prostate cancer   Relevant Orders   PSA   Elevated liver enzymes   Relevant Orders   Hepatic function panel   Hepatitis C antibody   HIV Antibody (routine testing w rflx)    Other Visit Diagnoses    Abnormal TSH       Relevant Orders   TSH      Meds ordered this encounter  Medications  . metFORMIN (GLUCOPHAGE-XR) 500 MG 24 hr tablet    Sig: Take one daily with evening meal.    Dispense:  90 tablet    Refill:  1  . atorvastatin (LIPITOR) 40 MG tablet    Sig: Take one daily.    Dispense:  90 tablet    Refill:  1  . lisinopril (ZESTRIL) 40 MG tablet    Sig: Take 1 tablet (40 mg total) by mouth  daily.    Dispense:  90 tablet    Refill:  1    Follow-up: Return in about 6 months (around 07/28/2020).    Libby Maw, MD

## 2020-01-29 LAB — URINALYSIS, ROUTINE W REFLEX MICROSCOPIC
Bilirubin Urine: NEGATIVE
Hgb urine dipstick: NEGATIVE
Ketones, ur: NEGATIVE
Leukocytes,Ua: NEGATIVE
Nitrite: NEGATIVE
Specific Gravity, Urine: 1.025 (ref 1.000–1.030)
Total Protein, Urine: 30 — AB
Urine Glucose: NEGATIVE
Urobilinogen, UA: 0.2 (ref 0.0–1.0)
pH: 6 (ref 5.0–8.0)

## 2020-01-29 LAB — HEPATIC FUNCTION PANEL
ALT: 24 U/L (ref 0–53)
AST: 24 U/L (ref 0–37)
Albumin: 4.1 g/dL (ref 3.5–5.2)
Alkaline Phosphatase: 70 U/L (ref 39–117)
Bilirubin, Direct: 0.1 mg/dL (ref 0.0–0.3)
Total Bilirubin: 0.3 mg/dL (ref 0.2–1.2)
Total Protein: 6.8 g/dL (ref 6.0–8.3)

## 2020-01-29 LAB — BASIC METABOLIC PANEL
BUN: 20 mg/dL (ref 6–23)
CO2: 26 mEq/L (ref 19–32)
Calcium: 9.9 mg/dL (ref 8.4–10.5)
Chloride: 104 mEq/L (ref 96–112)
Creatinine, Ser: 1.02 mg/dL (ref 0.40–1.50)
GFR: 77.76 mL/min (ref >60.00)
Glucose, Bld: 127 mg/dL — ABNORMAL HIGH (ref 70–99)
Potassium: 3.6 mEq/L (ref 3.5–5.1)
Sodium: 140 mEq/L (ref 135–145)

## 2020-01-29 LAB — MICROALBUMIN / CREATININE URINE RATIO
Creatinine,U: 105.2 mg/dL
Microalb Creat Ratio: 42.7 mg/g — ABNORMAL HIGH (ref 0.0–30.0)
Microalb, Ur: 44.9 mg/dL — ABNORMAL HIGH (ref 0.0–1.9)

## 2020-01-29 LAB — CBC
HCT: 41.7 % (ref 39.0–52.0)
Hemoglobin: 13.8 g/dL (ref 13.0–17.0)
MCHC: 33.1 g/dL (ref 30.0–36.0)
MCV: 80.5 fl (ref 78.0–100.0)
Platelets: 182 10*3/uL (ref 150.0–400.0)
RBC: 5.18 Mil/uL (ref 4.22–5.81)
RDW: 13.5 % (ref 11.5–15.5)
WBC: 6.3 10*3/uL (ref 4.0–10.5)

## 2020-01-29 LAB — HEMOGLOBIN A1C: Hgb A1c MFr Bld: 6.8 % — ABNORMAL HIGH (ref 4.6–6.5)

## 2020-01-29 LAB — PSA: PSA: 0.16 ng/mL (ref 0.10–4.00)

## 2020-01-29 LAB — LDL CHOLESTEROL, DIRECT: Direct LDL: 115 mg/dL

## 2020-01-29 LAB — HIV ANTIBODY (ROUTINE TESTING W REFLEX): HIV 1&2 Ab, 4th Generation: NONREACTIVE

## 2020-01-29 LAB — HEPATITIS C ANTIBODY
Hepatitis C Ab: NONREACTIVE
SIGNAL TO CUT-OFF: 0.01 (ref <1.00)

## 2020-01-29 LAB — TSH: TSH: 1.33 u[IU]/mL (ref 0.35–4.50)

## 2020-06-22 ENCOUNTER — Telehealth: Payer: Self-pay | Admitting: Family Medicine

## 2020-06-22 ENCOUNTER — Encounter: Payer: Self-pay | Admitting: Nurse Practitioner

## 2020-06-22 ENCOUNTER — Ambulatory Visit: Payer: 59 | Admitting: Nurse Practitioner

## 2020-06-22 ENCOUNTER — Other Ambulatory Visit: Payer: Self-pay

## 2020-06-22 VITALS — Temp 97.3°F | Wt 159.2 lb

## 2020-06-22 DIAGNOSIS — E119 Type 2 diabetes mellitus without complications: Secondary | ICD-10-CM | POA: Diagnosis not present

## 2020-06-22 DIAGNOSIS — I10 Essential (primary) hypertension: Secondary | ICD-10-CM | POA: Diagnosis not present

## 2020-06-22 LAB — BASIC METABOLIC PANEL
BUN: 20 mg/dL (ref 6–23)
CO2: 31 mEq/L (ref 19–32)
Calcium: 9.8 mg/dL (ref 8.4–10.5)
Chloride: 104 mEq/L (ref 96–112)
Creatinine, Ser: 1.08 mg/dL (ref 0.40–1.50)
GFR: 72.41 mL/min (ref >60.00)
Glucose, Bld: 94 mg/dL (ref 70–99)
Potassium: 4.2 mEq/L (ref 3.5–5.1)
Sodium: 141 mEq/L (ref 135–145)

## 2020-06-22 LAB — CBC
HCT: 41.5 % (ref 39.0–52.0)
Hemoglobin: 13.4 g/dL (ref 13.0–17.0)
MCHC: 32.4 g/dL (ref 30.0–36.0)
MCV: 80.4 fl (ref 78.0–100.0)
Platelets: 166 10*3/uL (ref 150.0–400.0)
RBC: 5.16 Mil/uL (ref 4.22–5.81)
RDW: 14.4 % (ref 11.5–15.5)
WBC: 6 10*3/uL (ref 4.0–10.5)

## 2020-06-22 LAB — TSH: TSH: 0.97 u[IU]/mL (ref 0.35–4.50)

## 2020-06-22 LAB — HEMOGLOBIN A1C: Hgb A1c MFr Bld: 6.8 % — ABNORMAL HIGH (ref 4.6–6.5)

## 2020-06-22 NOTE — Patient Instructions (Signed)
Go to lab for blood draw Continue medications as prescribed. Call office if you have another similar episode.  Syncope  Syncope refers to a condition in which a person temporarily loses consciousness. Syncope may also be called fainting or passing out. It is caused by a sudden decrease in blood flow to the brain. Even though most causes of syncope are not dangerous, syncope can be a sign of a serious medical problem. Your health care provider may do tests to find the reason why you are having syncope. Signs that you may be about to faint include:  Feeling dizzy or light-headed.  Feeling nauseous.  Seeing all white or all black in your field of vision.  Having cold, clammy skin. If you faint, get medical help right away. Call your local emergency services (911 in the [place] Do not drive yourself to the hospital. Follow these instructions at home: Pay attention to any changes in your symptoms. Take these actions to stay safe and to help relieve your symptoms: Lifestyle  Do not drive, use machinery, or play sports until your health care provider says it is okay.  Do not drink alcohol.  Do not use any products that contain nicotine or tobacco, such as cigarettes and e-cigarettes. If you need help quitting, ask your health care provider.  Drink enough fluid to keep your urine pale yellow. General instructions  Take over-the-counter and prescription medicines only as told by your health care provider.  If you are taking blood pressure or heart medicine, get up slowly and take several minutes to sit and then stand. This can reduce dizziness or light-headedness.  Have someone stay with you until you feel stable.  If you start to feel like you might faint, lie down right away and raise (elevate) your feet above the level of your heart. Breathe deeply and steadily. Wait until all the symptoms have passed.  Keep all follow-up visits as told by your health care provider. This is  important. Get help right away if you:  Have a severe headache.  Faint once or repeatedly.  Have pain in your chest, abdomen, or back.  Have a very fast or irregular heartbeat (palpitations).  Have pain when you breathe.  Are bleeding from your mouth or rectum, or you have black or tarry stool.  Have a seizure.  Are confused.  Have trouble walking.  Have severe weakness.  Have vision problems. These symptoms may represent a serious problem that is an emergency. Do not wait to see if your symptoms will go away. Get medical help right away. Call your local emergency services (911 in the [place] Do not drive yourself to the hospital. Summary  Syncope refers to a condition in which a person temporarily loses consciousness. It is caused by a sudden decrease in blood flow to the brain.  Signs that you may be about to faint include dizziness, feeling light-headed, feeling nauseous, sudden vision changes, or cold, clammy skin.  Although most causes of syncope are not dangerous, syncope can be a sign of a serious medical problem. If you faint, get medical help right away. This information is not intended to replace advice given to you by your health care provider. Make sure you discuss any questions you have with your health care provider. Document Revised: 07/30/2019 Document Reviewed: 07/30/2019 Elsevier Patient Education  [employer].

## 2020-06-22 NOTE — Telephone Encounter (Signed)
Patient called the office to report that he thinks he passed out yesterday. Call was transferred to the triage nurse for further evaluation. Triage nurse called back and reported that patient declined to call 911, go to the ED or Urgent Care. She asked if patient could be seen today by one of our providers. Patient is scheduled for today with [facility].

## 2020-06-22 NOTE — Progress Notes (Signed)
Subjective:  Patient ID: Kailyn Dubie, male    DOB: Jul 15, 1955  Age: 65 y.o. MRN: [id number]  CC: Acute Visit (Pt states he passed out yesterday morning. Pt states he woke up to get ready for work and after fixing some food he sat on the couch and just remembers waking up later once woke up by his sister because he missed work. Pt states he slept the entire day. Pt states he feels fine now)  HPI Mr. Jaquith present with possible syncopal episode yesterday. He woke up at 6am, sat down on couch about 6:30am to eat a sandwich, then fell asleep. His sister woke him up at about 12noon. Since he was late for work, he decided to go back to sleep till about 6pm. He thinks he was just tired from a busy weekend (taking care of his grand and great-grand sons, working in his yard).  He denies any confusion or fall or incontinence or post-ictal symptoms or change in speech. He denies any previous similar episode. He works as a Dealer. No postural dizziness or work related injury or fall 158/90: no BP medication taken No glucose check at home.  Reviewed past Medical, Social and Family history today.  Outpatient Medications Prior to Visit  Medication Sig Dispense Refill  . atorvastatin (LIPITOR) 40 MG tablet Take one daily. 90 tablet 1  . lisinopril (ZESTRIL) 40 MG tablet Take 1 tablet (40 mg total) by mouth daily. 90 tablet 1  . metFORMIN (GLUCOPHAGE-XR) 500 MG 24 hr tablet Take one daily with evening meal. 90 tablet 1  . diclofenac sodium (VOLTAREN) 1 % GEL Apply a nickel sized amount to right shoulder 4 times daily as needed for pain. (Patient not taking: No sig reported) 100 g 0   Facility-Administered Medications Prior to Visit  Medication Dose Route Frequency Provider Last Rate Last Admin  . methylPREDNISolone acetate (DEPO-MEDROL) injection 40 mg  40 mg Intramuscular Once Matthews, Cody, DO        ROS See HPI  Objective:  Temp (!) 97.3 F (36.3 C) (Temporal)   Wt 159 lb 3.2 oz (72.2 kg)    BMI 24.21 kg/m   Physical Exam Vitals reviewed.  Constitutional:      General: He is not in acute distress. Cardiovascular:     Rate and Rhythm: Normal rate and regular rhythm.     Pulses: Normal pulses.     Heart sounds: Normal heart sounds.  Pulmonary:     Effort: Pulmonary effort is normal.     Breath sounds: Normal breath sounds.  Musculoskeletal:     Cervical back: Normal range of motion and neck supple.     Right lower leg: No edema.     Left lower leg: No edema.  Neurological:     Mental Status: He is alert and oriented to person, place, and time.     Cranial Nerves: No cranial nerve deficit.     Motor: No weakness.     Coordination: Coordination normal.     Gait: Gait normal.  Psychiatric:        Mood and Affect: Mood normal.        Behavior: Behavior normal.    Assessment & Plan:  This visit occurred during the SARS-CoV-2 public health emergency.  Safety protocols were in place, including screening questions prior to the visit, additional usage of staff PPE, and extensive cleaning of exam room while observing appropriate contact time as indicated for disinfecting solutions.   Sevin was seen today  for acute visit.  Diagnoses and all orders for this visit:  Essential hypertension -     Basic metabolic panel -     CBC -     TSH  Type 2 diabetes mellitus without complication, without long-term current use of insulin (HCC) -     Hemoglobin A1c  stable HgbA1c at 6.8 Normal BMP and TSH advised to take medications as prescribed Provided verbal and printed ED precautions  Problem List Items Addressed This Visit      Cardiovascular and Mediastinum   Essential hypertension - Primary   Relevant Orders   Basic metabolic panel   CBC   TSH     Endocrine   Type 2 diabetes mellitus without complication, without long-term current use of insulin ([place])   Relevant Orders   Hemoglobin A1c      Follow-up: No follow-ups on file.  Wilfred Lacy, NP

## 2020-09-30 ENCOUNTER — Other Ambulatory Visit: Payer: Self-pay | Admitting: Family Medicine

## 2020-09-30 DIAGNOSIS — E119 Type 2 diabetes mellitus without complications: Secondary | ICD-10-CM

## 2020-09-30 DIAGNOSIS — E7800 Pure hypercholesterolemia, unspecified: Secondary | ICD-10-CM

## 2020-11-24 ENCOUNTER — Ambulatory Visit: Payer: 59 | Admitting: Family Medicine

## 2020-11-28 ENCOUNTER — Other Ambulatory Visit: Payer: Self-pay

## 2020-11-28 ENCOUNTER — Encounter: Payer: Self-pay | Admitting: Family Medicine

## 2020-11-28 ENCOUNTER — Ambulatory Visit: Payer: 59 | Admitting: Family Medicine

## 2020-11-28 VITALS — BP 142/70 | HR 81 | Temp 98.2°F | Ht 68.0 in | Wt 158.6 lb

## 2020-11-28 DIAGNOSIS — I10 Essential (primary) hypertension: Secondary | ICD-10-CM

## 2020-11-28 DIAGNOSIS — Z7289 Other problems related to lifestyle: Secondary | ICD-10-CM

## 2020-11-28 DIAGNOSIS — E119 Type 2 diabetes mellitus without complications: Secondary | ICD-10-CM | POA: Diagnosis not present

## 2020-11-28 DIAGNOSIS — Z8546 Personal history of malignant neoplasm of prostate: Secondary | ICD-10-CM | POA: Diagnosis not present

## 2020-11-28 DIAGNOSIS — E7800 Pure hypercholesterolemia, unspecified: Secondary | ICD-10-CM | POA: Diagnosis not present

## 2020-11-28 DIAGNOSIS — Z789 Other specified health status: Secondary | ICD-10-CM

## 2020-11-28 MED ORDER — ATORVASTATIN CALCIUM 40 MG PO TABS: ORAL_TABLET | ORAL | 1 refills | Status: DC

## 2020-11-28 MED ORDER — LISINOPRIL 40 MG PO TABS: 40.0000 mg | ORAL_TABLET | Freq: Every day | ORAL | 1 refills | Status: DC

## 2020-11-28 MED ORDER — METFORMIN HCL ER 500 MG PO TB24
ORAL_TABLET | ORAL | 1 refills | Status: DC
Start: 2020-11-28 — End: 2021-12-12

## 2020-11-28 NOTE — Progress Notes (Signed)
Established Patient Office Visit  Subjective:  Patient ID: Elijah Goodman, male    DOB: 05/27/1955  Age: 65 y.o. MRN: [id number]  CC:  Chief Complaint  Patient presents with   Medication Refill    Refill/follow up on medications. No concerns.     HPI Elijah Goodman presents for follow-up of hypertension, elevated cholesterol, diabetes, alcohol use and prostate cancer.  Prostate cancer was treated with seeding some years ago.  He has done well with it.  Urine flow is good.  There is no nocturia.  Sexual life has been largely unaffected but does admit decreased ejaculate.  Has decreased alcohol intake but continues to drink 2-3 beers daily.  He notices a problem but is not ready to quit.  Family has been concerned.  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  Experienced syncopal episode back in March.  There has not been another.  Denies palpitations, sleep deprivation, lightheadedness, unilateral paresthesias or weakness.  Today but diabetes has been well controlled with metformin.  He has not had an eye exam.  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  Nonfasting.  Past Medical History:  Diagnosis Date   Prostate cancer ([place]) 2019    History reviewed. No pertinent surgical history.  History reviewed. No pertinent family history.  Social History   Socioeconomic History   Marital status: Married    Spouse name: Not on file   Number of children: Not on file   Years of education: Not on file   Highest education level: Not on file  Occupational History   Not on file  Tobacco Use   Smoking status: Never   Smokeless tobacco: Never  Vaping Use   Vaping Use: Never used  Substance and Sexual Activity   Alcohol use: Yes    Alcohol/week: 18.0 standard drinks    Types: 18 Cans of beer per week   Drug use: Yes    Types: Marijuana   Sexual activity: Not on file  Other Topics Concern   Not on file  Social History Narrative   Not on file   Social  Determinants of Health   Financial Resource Strain: Not on file  Food Insecurity: Not on file  Transportation Needs: Not on file  Physical Activity: Not on file  Stress: Not on file  Social Connections: Not on file  Intimate Partner Violence: Not on file    Outpatient Medications Prior to Visit  Medication Sig Dispense Refill   atorvastatin (LIPITOR) 40 MG tablet TAKE 1 TABLET BY MOUTH EVERY DAY FOR CHOLESTEROL 90 tablet 1   lisinopril (ZESTRIL) 40 MG tablet Take 1 tablet (40 mg total) by mouth daily. 90 tablet 1   metFORMIN (GLUCOPHAGE-XR) 500 MG 24 hr tablet TAKE 1 TABLET BY MOUTH EVERY DAY WITH EVENING MEAL 90 tablet 1   Facility-Administered Medications Prior to Visit  Medication Dose Route Frequency Provider Last Rate Last Admin   methylPREDNISolone acetate (DEPO-MEDROL) injection 40 mg  40 mg Intramuscular Once Luetta Nutting, DO        No Known Allergies  ROS Review of Systems  Constitutional:  Negative for diaphoresis, fatigue, fever and unexpected weight change.  HENT: Negative.    Eyes:  Negative for photophobia and visual disturbance.  Respiratory:  Negative for chest tightness, shortness of breath and wheezing.   Cardiovascular:  Negative for chest pain and palpitations.  Gastrointestinal: Negative.   Genitourinary:  Negative for difficulty urinating, frequency and urgency.  Musculoskeletal:  Negative  for joint swelling and myalgias.  Skin:  Negative for pallor and rash.  Neurological:  Negative for speech difficulty, weakness, numbness and headaches.  Psychiatric/Behavioral: Negative.       Objective:    Physical Exam Vitals and nursing note reviewed.  Constitutional:      General: He is not in acute distress.    Appearance: Normal appearance. He is normal weight. He is not ill-appearing, toxic-appearing or diaphoretic.  HENT:     Head: Normocephalic and atraumatic.     Right Ear: Tympanic membrane, ear canal and external ear normal.     Left Ear:  Tympanic membrane, ear canal and external ear normal.     Mouth/Throat:     Mouth: Mucous membranes are moist.     Pharynx: Oropharynx is clear. No oropharyngeal exudate or posterior oropharyngeal erythema.  Eyes:     General: No scleral icterus.       Right eye: No discharge.        Left eye: No discharge.     Extraocular Movements: Extraocular movements intact.     Conjunctiva/sclera: Conjunctivae normal.     Pupils: Pupils are equal, round, and reactive to light.  Neck:     Vascular: No carotid bruit.  Cardiovascular:     Rate and Rhythm: Normal rate and regular rhythm.  Pulmonary:     Effort: Pulmonary effort is normal.     Breath sounds: Normal breath sounds.  Abdominal:     General: Bowel sounds are normal.  Musculoskeletal:     Cervical back: No rigidity or tenderness.  Lymphadenopathy:     Cervical: No cervical adenopathy.  Skin:    General: Skin is warm and dry.  Neurological:     Mental Status: He is alert and oriented to person, place, and time.    BP (!) 142/70 (BP Location: Left Arm, Patient Position: Sitting, Cuff Size: Normal)   Pulse 81   Temp 98.2 F (36.8 C) (Temporal)   Ht '5\' 8"'$  (1.727 m)   Wt 158 lb 9.6 oz (71.9 kg)   SpO2 97%   BMI 24.12 kg/m  Wt Readings from Last 3 Encounters:  11/28/20 158 lb 9.6 oz (71.9 kg)  06/22/20 159 lb 3.2 oz (72.2 kg)  01/28/20 154 lb 3.2 oz (69.9 kg)     Health Maintenance Due  Topic Date Due   FOOT EXAM  Never done   OPHTHALMOLOGY EXAM  Never done   TETANUS/TDAP  Never done   PNA vac Low Risk Adult (1 of 2 - PCV13) Never done   INFLUENZA VACCINE  10/31/2020    There are no preventive care reminders to display for this patient.  Lab Results  Component Value Date   TSH 0.97 06/22/2020   Lab Results  Component Value Date   WBC 6.0 06/22/2020   HGB 13.4 06/22/2020   HCT 41.5 06/22/2020   MCV 80.4 06/22/2020   PLT 166.0 06/22/2020   Lab Results  Component Value Date   NA 141 06/22/2020   K 4.2  06/22/2020   CO2 31 06/22/2020   GLUCOSE 94 06/22/2020   BUN 20 06/22/2020   CREATININE 1.08 06/22/2020   BILITOT 0.3 01/28/2020   ALKPHOS 70 01/28/2020   AST 24 01/28/2020   ALT 24 01/28/2020   PROT 6.8 01/28/2020   ALBUMIN 4.1 01/28/2020   CALCIUM 9.8 06/22/2020   GFR 72.41 06/22/2020   Lab Results  Component Value Date   CHOL 116 06/28/2017   Lab Results  Component Value Date   HDL 60.10 06/28/2017   Lab Results  Component Value Date   LDLCALC 44 06/28/2017   Lab Results  Component Value Date   TRIG 60.0 06/28/2017   Lab Results  Component Value Date   CHOLHDL 2 06/28/2017   Lab Results  Component Value Date   HGBA1C 6.8 (H) 06/22/2020      Assessment & Plan:   Problem List Items Addressed This Visit       Cardiovascular and Mediastinum   Essential hypertension   Relevant Medications   atorvastatin (LIPITOR) 40 MG tablet   lisinopril (ZESTRIL) 40 MG tablet   Other Relevant Orders   CBC   Comprehensive metabolic panel   Urinalysis, Routine w reflex microscopic   Microalbumin / creatinine urine ratio     Endocrine   Type 2 diabetes mellitus without complication, without long-term current use of insulin (HCC)   Relevant Medications   atorvastatin (LIPITOR) 40 MG tablet   lisinopril (ZESTRIL) 40 MG tablet   metFORMIN (GLUCOPHAGE-XR) 500 MG 24 hr tablet   Other Relevant Orders   Comprehensive metabolic panel   Hemoglobin A1c   Urinalysis, Routine w reflex microscopic   Microalbumin / creatinine urine ratio   Ambulatory referral to Ophthalmology     Other   Elevated LDL cholesterol level   Relevant Medications   atorvastatin (LIPITOR) 40 MG tablet   Other Relevant Orders   Comprehensive metabolic panel   LDL cholesterol, direct   Alcohol use   History of prostate cancer - Primary   Relevant Orders   PSA    Meds ordered this encounter  Medications   atorvastatin (LIPITOR) 40 MG tablet    Sig: TAKE 1 TABLET BY MOUTH EVERY DAY FOR  CHOLESTEROL    Dispense:  90 tablet    Refill:  1   lisinopril (ZESTRIL) 40 MG tablet    Sig: Take 1 tablet (40 mg total) by mouth daily.    Dispense:  90 tablet    Refill:  1   metFORMIN (GLUCOPHAGE-XR) 500 MG 24 hr tablet    Sig: TAKE 1 TABLET BY MOUTH EVERY DAY WITH EVENING MEAL    Dispense:  90 tablet    Refill:  1    Follow-up: Return in about 3 months (around 02/28/2021).  Given information on drugs of abuse and recovery from addiction.  Advised that stimulant drugs and medications for that matter or cardiac irritants and should be avoided and or discontinued.  Reminded him that alcohol is cardiac irritant and to stop drinking.  Agrees to see the ophthalmologist for an eye check.  Libby Maw, MD

## 2020-11-29 LAB — COMPREHENSIVE METABOLIC PANEL
ALT: 20 U/L (ref 0–53)
AST: 17 U/L (ref 0–37)
Albumin: 3.9 g/dL (ref 3.5–5.2)
Alkaline Phosphatase: 68 U/L (ref 39–117)
BUN: 19 mg/dL (ref 6–23)
CO2: 25 mEq/L (ref 19–32)
Calcium: 9.4 mg/dL (ref 8.4–10.5)
Chloride: 106 mEq/L (ref 96–112)
Creatinine, Ser: 1.06 mg/dL (ref 0.40–1.50)
GFR: 73.82 mL/min (ref >60.00)
Glucose, Bld: 117 mg/dL — ABNORMAL HIGH (ref 70–99)
Potassium: 3.6 mEq/L (ref 3.5–5.1)
Sodium: 138 mEq/L (ref 135–145)
Total Bilirubin: 0.4 mg/dL (ref 0.2–1.2)
Total Protein: 6.3 g/dL (ref 6.0–8.3)

## 2020-11-29 LAB — URINALYSIS, ROUTINE W REFLEX MICROSCOPIC
Bilirubin Urine: NEGATIVE
Ketones, ur: NEGATIVE
Leukocytes,Ua: NEGATIVE
Nitrite: NEGATIVE
RBC / HPF: NONE SEEN (ref 0–?)
Specific Gravity, Urine: >=1.03 — AB (ref 1.000–1.030)
Urine Glucose: NEGATIVE
Urobilinogen, UA: 0.2 (ref 0.0–1.0)
pH: 5.5 (ref 5.0–8.0)

## 2020-11-29 LAB — PSA: PSA: 0.06 ng/mL — ABNORMAL LOW (ref 0.10–4.00)

## 2020-11-29 LAB — MICROALBUMIN / CREATININE URINE RATIO
Creatinine,U: 146.4 mg/dL
Microalb Creat Ratio: 11.2 mg/g (ref 0.0–30.0)
Microalb, Ur: 16.3 mg/dL — ABNORMAL HIGH (ref 0.0–1.9)

## 2020-11-29 LAB — CBC
HCT: 39.3 % (ref 39.0–52.0)
Hemoglobin: 12.6 g/dL — ABNORMAL LOW (ref 13.0–17.0)
MCHC: 32.2 g/dL (ref 30.0–36.0)
MCV: 81.9 fl (ref 78.0–100.0)
Platelets: 144 10*3/uL — ABNORMAL LOW (ref 150.0–400.0)
RBC: 4.8 Mil/uL (ref 4.22–5.81)
RDW: 14.5 % (ref 11.5–15.5)
WBC: 6.4 10*3/uL (ref 4.0–10.5)

## 2020-11-29 LAB — HEMOGLOBIN A1C: Hgb A1c MFr Bld: 6.8 % — ABNORMAL HIGH (ref 4.6–6.5)

## 2020-11-29 LAB — LDL CHOLESTEROL, DIRECT: Direct LDL: 56 mg/dL

## 2021-05-27 DIAGNOSIS — R319 Hematuria, unspecified: Secondary | ICD-10-CM | POA: Diagnosis not present

## 2021-06-12 DIAGNOSIS — R3129 Other microscopic hematuria: Secondary | ICD-10-CM | POA: Diagnosis not present

## 2021-06-12 DIAGNOSIS — C61 Malignant neoplasm of prostate: Secondary | ICD-10-CM | POA: Diagnosis not present

## 2021-06-12 DIAGNOSIS — R339 Retention of urine, unspecified: Secondary | ICD-10-CM | POA: Diagnosis not present

## 2021-07-04 ENCOUNTER — Other Ambulatory Visit: Payer: Self-pay | Admitting: Family Medicine

## 2021-07-04 DIAGNOSIS — I10 Essential (primary) hypertension: Secondary | ICD-10-CM

## 2021-07-14 DIAGNOSIS — C61 Malignant neoplasm of prostate: Secondary | ICD-10-CM | POA: Diagnosis not present

## 2021-07-14 DIAGNOSIS — R310 Gross hematuria: Secondary | ICD-10-CM | POA: Diagnosis not present

## 2021-08-18 DIAGNOSIS — R310 Gross hematuria: Secondary | ICD-10-CM | POA: Diagnosis not present

## 2021-11-16 ENCOUNTER — Other Ambulatory Visit: Payer: Self-pay

## 2021-11-16 NOTE — Patient Outreach (Signed)
[facility]) Care Management  11/16/2021  Elijah Goodman 29-Nov-1955 [id number]   Telephone call to patient for nurse call. Patient cannot talk.    Plan: RN CM will attempt again within 4 business days and send letter.  [name], RN, MSN [facility] Care Management Care Management Coordinator Direct Line [phone] Toll Free: [phone]  Fax: [phone]

## 2021-11-20 ENCOUNTER — Other Ambulatory Visit: Payer: Self-pay

## 2021-11-20 NOTE — Patient Outreach (Signed)
[facility]) Care Management  11/20/2021  Elijah Goodman [birth year] [id number]    Telephone call to patient for nurse call.  No answer. Unable to leave a message.  Plan: RN CM will attempt again within 4 business days.  [name], RN, MSN [facility] Care Management Care Management Coordinator Direct Line [phone] Toll Free: [phone]  Fax: [phone]

## 2021-11-23 ENCOUNTER — Other Ambulatory Visit: Payer: Self-pay

## 2021-11-23 NOTE — Patient Outreach (Addendum)
[facility]) Care Management  11/23/2021  Elijah Goodman 10-03-1955 [id number]   Telephone call to patient for nurse call.  No answer. Unable to leave a message.  Plan: RN CM will close case.    [name], RN, MSN [facility] Care Management Care Management Coordinator Direct Line [phone] Toll Free: [phone]  Fax: [phone]

## 2021-12-12 ENCOUNTER — Other Ambulatory Visit: Payer: Self-pay | Admitting: Family Medicine

## 2021-12-12 DIAGNOSIS — E119 Type 2 diabetes mellitus without complications: Secondary | ICD-10-CM

## 2021-12-27 ENCOUNTER — Encounter: Payer: Self-pay | Admitting: Family Medicine

## 2021-12-27 ENCOUNTER — Ambulatory Visit (INDEPENDENT_AMBULATORY_CARE_PROVIDER_SITE_OTHER): Payer: Medicare HMO | Admitting: Family Medicine

## 2021-12-27 VITALS — BP 142/78 | HR 45 | Temp 97.8°F | Ht 68.0 in | Wt 145.8 lb

## 2021-12-27 DIAGNOSIS — E782 Mixed hyperlipidemia: Secondary | ICD-10-CM

## 2021-12-27 DIAGNOSIS — R519 Headache, unspecified: Secondary | ICD-10-CM

## 2021-12-27 DIAGNOSIS — E119 Type 2 diabetes mellitus without complications: Secondary | ICD-10-CM

## 2021-12-27 DIAGNOSIS — I10 Essential (primary) hypertension: Secondary | ICD-10-CM

## 2021-12-27 DIAGNOSIS — R809 Proteinuria, unspecified: Secondary | ICD-10-CM | POA: Insufficient documentation

## 2021-12-27 LAB — URINALYSIS, ROUTINE W REFLEX MICROSCOPIC
Bilirubin Urine: NEGATIVE
Hgb urine dipstick: NEGATIVE
Ketones, ur: NEGATIVE
Leukocytes,Ua: NEGATIVE
Nitrite: NEGATIVE
Specific Gravity, Urine: 1.02 (ref 1.000–1.030)
Total Protein, Urine: 30 — AB
Urine Glucose: NEGATIVE
Urobilinogen, UA: 0.2 (ref 0.0–1.0)
pH: 6.5 (ref 5.0–8.0)

## 2021-12-27 LAB — BASIC METABOLIC PANEL
BUN: 12 mg/dL (ref 6–23)
CO2: 33 mEq/L — ABNORMAL HIGH (ref 19–32)
Calcium: 9.7 mg/dL (ref 8.4–10.5)
Chloride: 99 mEq/L (ref 96–112)
Creatinine, Ser: 1.25 mg/dL (ref 0.40–1.50)
GFR: 60.11 mL/min (ref >60.00)
Glucose, Bld: 188 mg/dL — ABNORMAL HIGH (ref 70–99)
Potassium: 3.5 mEq/L (ref 3.5–5.1)
Sodium: 136 mEq/L (ref 135–145)

## 2021-12-27 LAB — MICROALBUMIN / CREATININE URINE RATIO
Creatinine,U: 77.9 mg/dL
Microalb Creat Ratio: 49.4 mg/g — ABNORMAL HIGH (ref 0.0–30.0)
Microalb, Ur: 38.5 mg/dL — ABNORMAL HIGH (ref 0.0–1.9)

## 2021-12-27 LAB — HEMOGLOBIN A1C: Hgb A1c MFr Bld: 6.7 % — ABNORMAL HIGH (ref 4.6–6.5)

## 2021-12-27 MED ORDER — LISINOPRIL 40 MG PO TABS: 40.0000 mg | ORAL_TABLET | Freq: Every day | ORAL | 1 refills | Status: DC

## 2021-12-27 MED ORDER — ATORVASTATIN CALCIUM 40 MG PO TABS: ORAL_TABLET | ORAL | 1 refills | Status: DC

## 2021-12-27 MED ORDER — METFORMIN HCL ER 500 MG PO TB24: 500.0000 mg | ORAL_TABLET | Freq: Every day | ORAL | 1 refills | Status: AC

## 2021-12-27 NOTE — Progress Notes (Signed)
[facility] [address] [place] [address] Dept: [phone] Dept Fax: [phone]  Office Visit  Subjective:    Patient ID: Elijah Goodman, male    DOB: Aug 17, 1955, 66 y.o..   MRN: [id number]  Chief Complaint  Patient presents with   Headache    Pt c/o headache has taken Advil for symptoms would like refills on medication. No other concerns.    History of Present Illness:  Patient is in today complaining of a recurring headache since yesterday. He notes that he ran out of his blood pressure medication. He has had similar headaches at other times off his meds. He was surprised to hear that it had been more than a year since he saw Dr. Ethelene Hal for his routine care. Mr. Manzer has a history of hypertension. He is managed on lisinopril 40 mg daily. He has Type 2 diabetes, managed on metformin XR 500 mg daily.  He has hyperlipidemia, managed on atorvastatin 40 mg daily.  Past Medical History: Patient Active Problem List   Diagnosis Date Noted   PAC (premature atrial contraction) 06/05/2019   Acute pain of right shoulder 09/17/2017   Elevated liver enzymes 09/17/2017   Nonintractable episodic headache 09/17/2017   Essential hypertension 06/28/2017   Elevated LDL cholesterol level 06/28/2017   Type 2 diabetes mellitus without complication, without long-term current use of insulin ([place]) 06/28/2017   Alcohol use 06/28/2017   History of prostate cancer 06/28/2017   No past surgical history on file.  No family history on file.  Outpatient Medications Prior to Visit  Medication Sig Dispense Refill   atorvastatin (LIPITOR) 40 MG tablet TAKE 1 TABLET BY MOUTH EVERY DAY FOR CHOLESTEROL 90 tablet 1   lisinopril (ZESTRIL) 40 MG tablet TAKE 1 TABLET BY MOUTH EVERY DAY 90 tablet 0   metFORMIN (GLUCOPHAGE-XR) 500 MG 24 hr tablet TAKE 1 TABLET BY MOUTH EVERY DAY WITH EVENING MEAL 30 tablet 2   Facility-Administered Medications Prior to  Visit  Medication Dose Route Frequency Provider Last Rate Last Admin   methylPREDNISolone acetate (DEPO-MEDROL) injection 40 mg  40 mg Intramuscular Once Luetta Nutting, DO       No Known Allergies    Objective:   Today's Vitals   12/27/21 1405  BP: (!) 142/78  Pulse: (!) 45  Temp: 97.8 F (36.6 C)  TempSrc: Temporal  SpO2: 98%  Weight: 145 lb 12.8 oz (66.1 kg)  Height: '5\' 8"'$  (1.727 m)   Body mass index is 22.17 kg/m.   General: Well developed, well nourished. No acute distress. HEENT: Normocephalic, non-traumatic. PERRL, EOMI. Conjunctiva clear.  Lungs: Clear to auscultation bilaterally. No wheezing, rales or rhonchi. CV: IRRR without murmurs or rubs. Pulses 2+ bilaterally. Psych: Alert and oriented. Normal mood and affect.  Health Maintenance Due  Topic Date Due   FOOT EXAM  Never done   OPHTHALMOLOGY EXAM  Never done   TETANUS/TDAP  Never done   Pneumonia Vaccine 54+ Years old (1 - PCV) Never done   HEMOGLOBIN A1C  05/30/2021   INFLUENZA VACCINE  10/31/2021   Diabetic kidney evaluation - GFR measurement  11/28/2021   Diabetic kidney evaluation - Urine ACR  11/28/2021   Lab Results Last metabolic panel Lab Results  Component Value Date   GLUCOSE 117 (H) 11/28/2020   NA 138 11/28/2020   K 3.6 11/28/2020   CL 106 11/28/2020   CO2 25 11/28/2020   BUN 19 11/28/2020   CREATININE 1.06 11/28/2020  CALCIUM 9.4 11/28/2020   PROT 6.3 11/28/2020   ALBUMIN 3.9 11/28/2020   BILITOT 0.4 11/28/2020   ALKPHOS 68 11/28/2020   AST 17 11/28/2020   ALT 20 11/28/2020   Last lipids Lab Results  Component Value Date   CHOL 116 06/28/2017   HDL 60.10 06/28/2017   LDLCALC 44 06/28/2017   LDLDIRECT 56.0 11/28/2020   TRIG 60.0 06/28/2017   CHOLHDL 2 06/28/2017   Last hemoglobin A1c Lab Results  Component Value Date   HGBA1C 6.8 (H) 11/28/2020       Assessment & Plan:   1. Acute nonintractable headache, unspecified headache type Elijah Goodman notes he has had similar  headaches when he is off of his antihypertensives. I see no other explanation for his headache complaint. I will restart his lisinopril.  2. Hyperlipemia, mixed I will resume his atorvastatin. He should have fasting lipids checked when he returns to see Dr. Ethelene Hal.  - atorvastatin (LIPITOR) 40 MG tablet; TAKE 1 TABLET BY MOUTH EVERY DAY FOR CHOLESTEROL  Dispense: 90 tablet; Refill: 1  3. Essential hypertension Blood pressure is elevated today. I will resume his lisinopril. I will check his renal function today.  - Basic metabolic panel - lisinopril (ZESTRIL) 40 MG tablet; Take 1 tablet (40 mg total) by mouth daily.  Dispense: 90 tablet; Refill: 1  4. Type 2 diabetes mellitus without complication, without long-term current use of insulin (Elijah Goodman) I will check annual diabetes labs (except for lipids) today. I will continue his metformin. I strongly encouraged him to see his PCP for ongoing care.  - Microalbumin / creatinine urine ratio - Basic metabolic panel - Hemoglobin A1c - Urinalysis, Routine w reflex microscopic - metFORMIN (GLUCOPHAGE-XR) 500 MG 24 hr tablet; Take 1 tablet (500 mg total) by mouth daily with breakfast.  Dispense: 90 tablet; Refill: 1   Return in about 3 months (around 03/28/2022) for Reassessment with PCP.   Haydee Salter, MD

## 2022-03-28 ENCOUNTER — Ambulatory Visit: Payer: Medicare HMO | Admitting: Family Medicine

## 2022-04-10 ENCOUNTER — Ambulatory Visit: Payer: Medicare HMO | Admitting: Family Medicine

## 2022-04-10 ENCOUNTER — Encounter: Payer: Self-pay | Admitting: Family Medicine

## 2022-05-02 ENCOUNTER — Telehealth: Payer: Self-pay

## 2022-05-02 DIAGNOSIS — J039 Acute tonsillitis, unspecified: Secondary | ICD-10-CM | POA: Diagnosis not present

## 2022-05-02 DIAGNOSIS — R051 Acute cough: Secondary | ICD-10-CM | POA: Diagnosis not present

## 2022-05-02 NOTE — Patient Outreach (Signed)
  Care Coordination   05/02/2022 Name: Lyam Provencio MRN: [id number] DOB: 1955/04/28   Care Coordination Outreach Attempts:  An unsuccessful telephone outreach was attempted today to offer the patient information about available care coordination services as a benefit of their health plan.   Follow Up Plan:  Additional outreach attempts will be made to offer the patient care coordination information and services.   Encounter Outcome:  No Answer   Care Coordination Interventions:  No, not indicated    [name], RN, MSN [facility] Care Management Coordinator Direct Line [phone]

## 2022-05-14 ENCOUNTER — Telehealth: Payer: Self-pay | Admitting: Family Medicine

## 2022-05-14 NOTE — Telephone Encounter (Signed)
Left message for patient to call back and schedule Medicare Annual Wellness Visit (AWV) either virtually or in office. Left  my Herbie Drape number [phone]   awvi 05/03/22 per palmettto please schedule with Nurse Health Adviser   45 min for awv-i  in office appointments 30 min for awv-s & awv-i phone/virtual appointments

## 2022-05-25 ENCOUNTER — Telehealth: Payer: Self-pay

## 2022-05-25 NOTE — Patient Outreach (Signed)
  Care Coordination   Initial Visit Note   05/25/2022 Name: Sachit Franchina MRN: [id number] DOB: 1955/10/26  Pat Dlouhy is a 67 y.o. year old male who sees Libby Maw, MD for primary care. I spoke with  Brantley Stage by phone today.  What matters to the patients health and wellness today?  none    Goals Addressed             This Visit's Progress    COMPLETED: Care Coordination Activities-no follow up required       Interventions Today    Flowsheet Row Most Recent Value  Chronic Disease   Chronic disease during today's visit Diabetes, Other  [Hyperlipidemia]  General Interventions   General Interventions Discussed/Reviewed General Interventions Discussed, Doctor Visits  Doctor Visits Discussed/Reviewed Doctor Visits Discussed, Annual Wellness Visits  Education Interventions   Education Provided Provided Education  Provided Verbal Education On Medication  [Advised on medication adherence]  Nutrition Interventions   Nutrition Discussed/Reviewed Decreasing fats  Pharmacy Interventions   Pharmacy Dicussed/Reviewed Medications and their functions, Medication Adherence  Medication Adherence --  [patient states he is taking medications as prescribed.]              SDOH assessments and interventions completed:  Yes  SDOH Interventions Today    Flowsheet Row Most Recent Value  SDOH Interventions   Housing Interventions Intervention Not Indicated  Transportation Interventions Intervention Not Indicated        Care Coordination Interventions:  Yes, provided   Follow up plan: No further intervention required.   Encounter Outcome:  Pt. Visit Completed   [name], RN, MSN [facility] Care Management Coordinator Direct Line [phone]

## 2022-05-25 NOTE — Patient Instructions (Signed)
Visit Information  Thank you for taking time to visit with me today. Please don't hesitate to contact me if I can be of assistance to you.   Following are the goals we discussed today:   Goals Addressed             This Visit's Progress    COMPLETED: Care Coordination Activities-no follow up required       Interventions Today    Flowsheet Row Most Recent Value  Chronic Disease   Chronic disease during today's visit Diabetes, Other  [Hyperlipidemia]  General Interventions   General Interventions Discussed/Reviewed General Interventions Discussed, Doctor Visits  Doctor Visits Discussed/Reviewed Doctor Visits Discussed, Annual Wellness Visits  Education Interventions   Education Provided Provided Education  Provided Verbal Education On Medication  [Advised on medication adherence]  Nutrition Interventions   Nutrition Discussed/Reviewed Decreasing fats  Pharmacy Interventions   Pharmacy Dicussed/Reviewed Medications and their functions, Medication Adherence  Medication Adherence --  [patient states he is taking medications as prescribed.]               If you are experiencing a Mental Health or [place] or need someone to talk to, please call the Suicide and Crisis Lifeline: 988   The patient verbalized understanding of instructions, educational materials, and care plan provided today and DECLINED offer to receive copy of patient instructions, educational materials, and care plan.   No further follow up required: decline  [name], RN, MSN [facility] Care Management Coordinator Direct Line [phone]

## 2022-07-17 ENCOUNTER — Telehealth: Payer: Self-pay

## 2022-07-17 NOTE — Progress Notes (Signed)
Subjective/Objective: Patient outreach to discuss medication adherence after patient appeared on quality report identifying failed measures in 2023 by insurance in regard to adherence to oral diabetes medications, statin, and hypertension medications.  DM -A1c 6.7 at 12/27/21 visit -Currently prescribed Metformin XR 500mg  daily, but patient endorses not taking regularly due to concern with information he has read/heard about medication -Metformin was last filled 12/27/21 for 90 day supply -States he does not check home BG "like he should" and is unable to provide readings  HTN -BP 142/78 at 12/27/21 visit -Currently prescribed lisinopril 40mg  daily; last filled 12/27/21 for 90 day supply -Endorses adherence to medication, but does not state refill needed at this time -Dose not regularly monitor home BP  HLD -Direct LDL 11/29/20 was 56, but do not see any Lipids values other than/since this value -Prescribed atorvastatin 40mg  daily; last tilled 90 day supply on 12/27/21 -Patient endorses adherence and does state he needs a refill on medication  Assessment/Plans:  DM -Currently controlled -Counseled on efficacy of metformin in maintaining glycemic control; stated unless he is having adverse effects (which he does not endorse), medication is appropriate to take as prescribed -He states he plans to schedule follow up with Dr. Doreene Burke in the next couple of weeks and will discuss further -1 refill remains at [facility]  HTN -Currently uncontrolled -Patient does not appear to be adherent to daily lisinopril dosing -1 refill remains at [facility] -Recommend additional evaluation when at next PCP appointment; may need to consider additional therapy if he is taking lisinopril 40mg  every day and BP remains elevated.   -Addition of hctz 25mg  would be appropriate with f/u CMP in 2-4 weeks  HLD -Historically controlled, but patient is due for lipid panel at next PCP visit -If LDL remaining <70,  continue current regimen -Informed patient that 1 refill remains on atorvastatin 40mg  at [facility] and to contact them for refill  Follow-up:  None scheduled but can follow-up as needed per PCP  Lenna Gilford, PharmD, DPLA

## 2022-12-27 ENCOUNTER — Other Ambulatory Visit: Payer: Self-pay | Admitting: Family Medicine

## 2022-12-27 DIAGNOSIS — I10 Essential (primary) hypertension: Secondary | ICD-10-CM

## 2022-12-27 DIAGNOSIS — E782 Mixed hyperlipidemia: Secondary | ICD-10-CM

## 2023-02-05 ENCOUNTER — Ambulatory Visit: Payer: Medicare HMO | Admitting: Family Medicine

## 2023-12-10 DIAGNOSIS — M5431 Sciatica, right side: Secondary | ICD-10-CM | POA: Diagnosis not present

## 2023-12-10 DIAGNOSIS — M5441 Lumbago with sciatica, right side: Secondary | ICD-10-CM | POA: Diagnosis not present

## 2023-12-10 DIAGNOSIS — M5126 Other intervertebral disc displacement, lumbar region: Secondary | ICD-10-CM | POA: Diagnosis not present

## 2023-12-17 ENCOUNTER — Ambulatory Visit (INDEPENDENT_AMBULATORY_CARE_PROVIDER_SITE_OTHER): Admitting: Internal Medicine

## 2023-12-17 ENCOUNTER — Encounter: Payer: Self-pay | Admitting: Internal Medicine

## 2023-12-17 VITALS — BP 140/72 | HR 76 | Temp 98.0°F | Ht 67.0 in | Wt 158.0 lb

## 2023-12-17 DIAGNOSIS — M5441 Lumbago with sciatica, right side: Secondary | ICD-10-CM | POA: Diagnosis not present

## 2023-12-17 DIAGNOSIS — I10 Essential (primary) hypertension: Secondary | ICD-10-CM

## 2023-12-17 DIAGNOSIS — E782 Mixed hyperlipidemia: Secondary | ICD-10-CM | POA: Diagnosis not present

## 2023-12-17 MED ORDER — ATORVASTATIN CALCIUM 40 MG PO TABS: ORAL_TABLET | ORAL | 0 refills | Status: AC

## 2023-12-17 MED ORDER — TRIAMCINOLONE ACETONIDE 40 MG/ML IJ SUSP
40.0000 mg | Freq: Once | INTRAMUSCULAR | Status: AC
  Administered 2023-12-17: 40 mg via INTRAMUSCULAR

## 2023-12-17 MED ORDER — LISINOPRIL 40 MG PO TABS: 40.0000 mg | ORAL_TABLET | Freq: Every day | ORAL | 0 refills | Status: AC

## 2023-12-17 NOTE — Patient Instructions (Signed)
  VISIT SUMMARY: Today, you were seen for low back pain and right-sided sciatica that began after lifting a heavy box. You have been managing the pain with Tylenol, ibuprofen, and Lidoderm patches, but the pain persists, especially when walking. You also have ongoing management for hypertension and mixed hyperlipidemia.  YOUR PLAN: -RIGHT-SIDED LOW BACK PAIN WITH RIGHT SCIATICA: You have acute low back pain with sciatica, which is pain that radiates along the sciatic nerve from your lower back down to your leg. This likely occurred due to a lumbar herniated disc from lifting a heavy box. Today, you received a Kenalog  injection to help reduce inflammation and pain. Continue taking Tylenol, ibuprofen, and using lidocaine patches as needed. Rest, use ice packs, and apply a heating pad as necessary. You have been given a work excuse until Thursday. If there is no improvement by Monday, you will be referred to orthopedics for further evaluation, which may include x-rays and physical therapy.  -HYPERTENSION: Hypertension is high blood pressure. Your management for hypertension is ongoing, and a prescription for lisinopril  has been sent to [facility].  -MIXED HYPERLIPIDEMIA: Mixed hyperlipidemia is a condition where you have high levels of different types of fats in your blood. Your management for mixed hyperlipidemia is ongoing, and a prescription for atorvastatin  has been sent to [facility].  INSTRUCTIONS: Please follow up if there is no improvement in your back pain by Monday. Continue taking your prescribed medications for hypertension and mixed hyperlipidemia. Rest and avoid activities that may worsen your back pain.                      Contains text generated by [employer]bridge.                                 Contains text generated by Abridge.

## 2023-12-17 NOTE — Progress Notes (Signed)
 [facility] [address] [place] [address] Dept: [phone] Dept Fax: [phone]  Acute Care Office Visit  Subjective:   Elijah Goodman 07-18-1955 12/17/2023  Chief Complaint  Patient presents with   Hospitalization Follow-up    Right leg pain pain meds given not helping (tylenol) need med refill    HPI:  History of Present Illness   Elijah Goodman is a 68 year old male who presents with low back pain and right-sided sciatica.  He was seen at the ER on December 10, 2023, after experiencing low back pain and right hip pain following lifting a heavy box. The pain initially eased but worsened the next morning, preventing him from straightening his right leg. During the ER visit, he received a Toradol injection and Tylenol. He recalls being told he had right-sided low back pain with sciatica. No imaging was conducted at that time.  Since the ER visit, the pain has improved but remains present, particularly when walking. The pain has transitioned from a 'terrible, sharp pain' to a 'dull ache' that is constant. He experiences tingling around the knee intermittently. The pain is now primarily in the leg rather than the back.  He has been using Tylenol, ibuprofen, and Lidoderm patches as prescribed, but reports that while these treatments have not significantly alleviated the pain, they may be contributing to gradual improvement. He took Tylenol this morning and ibuprofen last night.  No bowel or bladder incontinence. He recalls a similar episode of leg pain about ten years ago, which was resolved with a week of prednisone. 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    He also needs refills on his HLD and Htn meds.   The following portions of the patient's history were reviewed and updated as appropriate: past medical history, past  surgical history, family history, social history, allergies, medications, and problem list.   Patient Active Problem List   Diagnosis Date Noted   Proteinuria 12/27/2021   PAC (premature atrial contraction) 06/05/2019   Acute pain of right shoulder 09/17/2017   Elevated liver enzymes 09/17/2017   Nonintractable episodic headache 09/17/2017   Essential hypertension 06/28/2017   Elevated LDL cholesterol level 06/28/2017   Type 2 diabetes mellitus without complication, without long-term current use of insulin (HCC) 06/28/2017   Alcohol use 06/28/2017   History of prostate cancer 06/28/2017   Past Medical History:  Diagnosis Date   Prostate cancer (HCC) 2019   History reviewed. No pertinent surgical history. History reviewed. No pertinent family history.  Current Outpatient Medications:    metFORMIN  (GLUCOPHAGE -XR) 500 MG 24 hr tablet, Take 1 tablet (500 mg total) by mouth daily with breakfast., Disp: 90 tablet, Rfl: 1   atorvastatin  (LIPITOR) 40 MG tablet, TAKE 1 TABLET BY MOUTH EVERY DAY FOR CHOLESTEROL, Disp: 90 tablet, Rfl: 0   lisinopril  (ZESTRIL ) 40 MG tablet, Take 1 tablet (40 mg total) by mouth daily., Disp: 90 tablet, Rfl: 0  Current Facility-Administered Medications:    triamcinolone  acetonide (KENALOG -40) injection 40 mg, 40 mg, Intramuscular, Once,  No Known Allergies   ROS: A complete ROS was performed with pertinent positives/negatives noted in the HPI. The remainder of the ROS are negative.    Objective:   Today's Vitals   12/17/23 1309  BP: (!) 140/72  Pulse: 76  Temp: 98 F (36.7 C)  TempSrc: Temporal  SpO2: 98%  Weight: 158 lb (  71.7 kg)  Height: 5' 7 (1.702 m)    GENERAL: Well-appearing, in NAD. Well nourished.  SKIN: Pink, warm and dry. No rash, lesion, ulceration, or ecchymoses.  NECK: Trachea midline. Full ROM w/o pain or tenderness. No lymphadenopathy.  RESPIRATORY: Chest wall symmetrical. Respirations even and non-labored.  CARDIAC: Peripheral  pulses 2+ bilaterally.  MSK: Muscle tone and strength appropriate for age. + straight leg raise on RLE. TTP to R. Lower back. EXTREMITIES: Without clubbing, cyanosis, or edema.  NEUROLOGIC: No motor or sensory deficits. Steady, even gait.  PSYCH/MENTAL STATUS: Alert, oriented x 3. Cooperative, appropriate mood and affect.    No results found for any visits on 12/17/23.    Assessment & Plan:  Assessment and Plan    Right-sided low back pain with right sciatica Acute right-sided low back pain with sciatica post heavy lifting. Pain improved with Tylenol, ibuprofen, and lidocaine patches. Considered steroid use due to past prednisone response, cautious of blood sugar impact. Possible orthopedic referral if no improvement. - Administered Kenalog  40 mg injection. - Continue Tylenol, ibuprofen, and lidocaine patches. - Advised rest, ice packs, and heating pad as needed. - Provided work excuse until Thursday. - Refer to orthopedics if no improvement by Monday for evaluation, potential x-rays, and physical therapy.  Hypertension Hypertension management ongoing. - Sent prescription for lisinopril  to [facility].  Mixed hyperlipidemia Mixed hyperlipidemia management ongoing. - Sent prescription for atorvastatin  to [facility].       Meds ordered this encounter  Medications   atorvastatin  (LIPITOR) 40 MG tablet    Sig: TAKE 1 TABLET BY MOUTH EVERY DAY FOR CHOLESTEROL    Dispense:  90 tablet    Refill:  0   lisinopril  (ZESTRIL ) 40 MG tablet    Sig: Take 1 tablet (40 mg total) by mouth daily.    Dispense:  90 tablet    Refill:  0   triamcinolone  acetonide (KENALOG -40) injection 40 mg   No orders of the defined types were placed in this encounter.  Lab Orders  No laboratory test(s) ordered today   No images are attached to the encounter or orders placed in the encounter.  Return if symptoms worsen or fail to improve.   Rosina Senters, FNP

## 2023-12-18 ENCOUNTER — Telehealth: Payer: Self-pay | Admitting: Family Medicine

## 2023-12-18 NOTE — Telephone Encounter (Signed)
 Copied from CRM [id number]. Topic: Referral - Request for Referral >> Dec 18, 2023  2:57 PM Mesmerise C wrote: Did the patient discuss referral with their provider in the last year? Yes (If No - schedule appointment) (If Yes - send message) Stated he discuss with Rosina Senters and she gave him a prednisone shot but would still like a referral  Appointment offered? Yes  Type of order/referral and detailed reason for visit: Orthopedics Surgeon  Preference of office, provider, location: somewhere [facility]  If referral order, have you been seen by this specialty before? No (If Yes, this issue or another issue? When? Where?  Can we respond through MyChart? No

## 2023-12-19 ENCOUNTER — Other Ambulatory Visit: Payer: Self-pay | Admitting: Internal Medicine

## 2023-12-19 DIAGNOSIS — Z5189 Encounter for other specified aftercare: Secondary | ICD-10-CM

## 2023-12-19 DIAGNOSIS — M5441 Lumbago with sciatica, right side: Secondary | ICD-10-CM

## 2023-12-19 NOTE — Telephone Encounter (Signed)
 Referral placed.

## 2024-01-03 ENCOUNTER — Encounter: Payer: Self-pay | Admitting: Physical Medicine and Rehabilitation

## 2024-01-03 ENCOUNTER — Ambulatory Visit: Admitting: Physical Medicine and Rehabilitation

## 2024-01-03 ENCOUNTER — Other Ambulatory Visit (INDEPENDENT_AMBULATORY_CARE_PROVIDER_SITE_OTHER): Payer: Self-pay

## 2024-01-03 DIAGNOSIS — M5441 Lumbago with sciatica, right side: Secondary | ICD-10-CM

## 2024-01-03 DIAGNOSIS — M5416 Radiculopathy, lumbar region: Secondary | ICD-10-CM

## 2024-01-03 MED ORDER — MELOXICAM 15 MG PO TABS
15.0000 mg | ORAL_TABLET | Freq: Every day | ORAL | 0 refills | Status: AC
Start: 2024-01-03 — End: 2025-01-02

## 2024-01-03 MED ORDER — PREDNISONE 50 MG PO TABS: 50.0000 mg | ORAL_TABLET | Freq: Every day | ORAL | 0 refills | Status: AC

## 2024-01-03 NOTE — Progress Notes (Signed)
 Elijah Goodman - 69 y.o. male MRN [id number]  Date of birth: May 05, 1955  Office Visit Note: Visit Date: 01/03/2024 PCP: Berneta Elsie Sayre, MD Referred by: Billy Knee, FNP  Subjective: Chief Complaint  Patient presents with   Lower Back - Pain   HPI: Elijah Goodman is a 68 y.o. male who comes in today Per the request of Knee Billy, NP for evaluation of acute right sided lower back pain radiating to anterolateral thigh down to knee. Pain started about 3 weeks ago. His pain is constant, no specific aggravating factors. He describes pain as bone being ripped from his leg. Reports numbness sensation to right knee, currently rates as 9 out of 10. Some relief of pain with home exercise regimen, rest and use of medications. He is alternating Tylenol and Ibuprofen with some relief of pain. He was evaluated in [facility] on 12/10/2023 for same issue. No prior imaging of lumbar spine. No history of lumbar surgery/injections. Patient denies recent trauma or falls. He is currently using cane to assist with ambulation.   Patients course is complicated by diabetes mellitus, alcohol use and prostate cancer.      Review of Systems  Musculoskeletal:  Positive for back pain.  Neurological:  Negative for tingling, sensory change, focal weakness and weakness.  All other systems reviewed and are negative.  Otherwise per HPI.  Assessment & Plan: Visit Diagnoses:    ICD-10-CM   1. Acute right-sided low back pain with right-sided sciatica  M54.41 XR Lumbar Spine 2-3 Views    2. Lumbar radiculopathy  M54.16 XR Lumbar Spine 2-3 Views       Plan: Findings:  Acute right sided lower back pain radiating to anterolateral thigh down to knee. Patient continues to have pain despite good conservative therapies such as home exercise regimen, rest and use of medications. Patients clinical presentation and exam are complex, differentials include lumbar radiculopathy vs lumbar strain. I  obtained lumbar radiographs in the office today that show multi level degenerative changes, most prominent at L5-S1. There is joint space narrowing noted to hips bilaterally, right greater than left. We discussed treatment plan in detail today. I prescribed short course of oral Prednisone and Meloxicam. I instructed him to start Meloxicam after finishing 3 days of Prednisone. I also placed order for physical therapy. I will see him back in 8 weeks for follow up. Should his pain persist would consider obtaining lumbar MRI imaging. His exam today is non focal, good strength noted to bilateral lower extremities. No pain noted with internal/external rotation of right hip. No red flag symptoms noted upon exam today.     Meds & Orders: No orders of the defined types were placed in this encounter.   Orders Placed This Encounter  Procedures   XR Lumbar Spine 2-3 Views    Follow-up: Return for 8 week follow up post PT.   Procedures: No procedures performed      Clinical History: No specialty comments available.   He reports that he has never smoked. He has never used smokeless tobacco. No results for input(s): HGBA1C, LABURIC in the last 8760 hours.  Objective:  VS:  HT:    WT:   BMI:     BP:   HR: bpm  TEMP: ( )  RESP:  Physical Exam Vitals and nursing note reviewed.  HENT:     Head: Normocephalic and atraumatic.     Right Ear: External ear normal.     Left Ear: External ear  normal.     Nose: Nose normal.     Mouth/Throat:     Mouth: Mucous membranes are moist.  Eyes:     Extraocular Movements: Extraocular movements intact.  Cardiovascular:     Rate and Rhythm: Normal rate.     Pulses: Normal pulses.  Pulmonary:     Effort: Pulmonary effort is normal.  Abdominal:     General: Abdomen is flat. There is no distension.  Musculoskeletal:        General: Tenderness present.     Cervical back: Normal range of motion.     Comments: Patient rises from seated position to standing  without difficulty. Good lumbar range of motion. No pain noted with facet loading. 5/5 strength noted with bilateral hip flexion, knee flexion/extension, ankle dorsiflexion/plantarflexion and EHL. No clonus noted bilaterally. No pain upon palpation of greater trochanters. No pain with internal/external rotation of bilateral hips. Decreased sensation to right upper thigh/knee region compared to left leg. Negative slump test bilaterally. Ambulates without aid, gait steady.     Skin:    General: Skin is warm and dry.     Capillary Refill: Capillary refill takes less than 2 seconds.  Neurological:     General: No focal deficit present.     Mental Status: He is alert and oriented to person, place, and time.  Psychiatric:        Mood and Affect: Mood normal.        Behavior: Behavior normal.     Ortho Exam  Imaging: XR Lumbar Spine 2-3 Views Result Date: 01/03/2024 AP and lateral radiographs of lumbar spine show normal alignment and segmentation. There are multi level degenerative changes, most significant at L5-S1 where there is disc height loss, biforaminal narrowing, facet arthropathy and anterior osteophytes. Joint space narrowing noted to bilateral hips, right greater than left. No spondylolisthesis. No fractures.    Past Medical/Family/Surgical/Social History: Medications & Allergies reviewed per EMR, new medications updated. Patient Active Problem List   Diagnosis Date Noted   Proteinuria 12/27/2021   PAC (premature atrial contraction) 06/05/2019   Acute pain of right shoulder 09/17/2017   Elevated liver enzymes 09/17/2017   Nonintractable episodic headache 09/17/2017   Essential hypertension 06/28/2017   Elevated LDL cholesterol level 06/28/2017   Type 2 diabetes mellitus without complication, without long-term current use of insulin (HCC) 06/28/2017   Alcohol use 06/28/2017   History of prostate cancer 06/28/2017   Past Medical History:  Diagnosis Date   Prostate cancer (HCC)  2019   History reviewed. No pertinent family history. History reviewed. No pertinent surgical history. Social History   Occupational History   Not on file  Tobacco Use   Smoking status: Never   Smokeless tobacco: Never  Vaping Use   Vaping status: Never Used  Substance and Sexual Activity   Alcohol use: Yes    Alcohol/week: 18.0 standard drinks of alcohol    Types: 18 Cans of beer per week   Drug use: Yes    Types: Marijuana   Sexual activity: Not on file

## 2024-01-03 NOTE — Progress Notes (Signed)
 Pain Scale   Average Pain 10 Patient advising his lower back pain radiates to his hip and right leg.        +Driver, -BT, -Dye Allergies.

## 2024-01-20 ENCOUNTER — Ambulatory Visit

## 2024-01-24 DIAGNOSIS — E785 Hyperlipidemia, unspecified: Secondary | ICD-10-CM | POA: Diagnosis not present

## 2024-01-24 DIAGNOSIS — E119 Type 2 diabetes mellitus without complications: Secondary | ICD-10-CM | POA: Diagnosis not present

## 2024-01-24 DIAGNOSIS — I10 Essential (primary) hypertension: Secondary | ICD-10-CM | POA: Diagnosis not present

## 2024-01-24 DIAGNOSIS — Z8546 Personal history of malignant neoplasm of prostate: Secondary | ICD-10-CM | POA: Diagnosis not present

## 2024-01-24 DIAGNOSIS — M5430 Sciatica, unspecified side: Secondary | ICD-10-CM | POA: Diagnosis not present

## 2024-02-26 ENCOUNTER — Ambulatory Visit: Admitting: Physical Medicine and Rehabilitation

## 2024-04-21 ENCOUNTER — Other Ambulatory Visit: Payer: Self-pay | Admitting: Internal Medicine

## 2024-04-21 DIAGNOSIS — E782 Mixed hyperlipidemia: Secondary | ICD-10-CM

## 2024-04-21 DIAGNOSIS — I10 Essential (primary) hypertension: Secondary | ICD-10-CM

## 2024-04-22 ENCOUNTER — Telehealth: Payer: Self-pay

## 2024-04-22 NOTE — Telephone Encounter (Signed)
 LR 12/17/23, #90, 0 rf LOV 12/17/23 FOV  none scheduled.  Lisinopril  and atorvastatin   Tried calling number in chart to advise that he is over due for a follow up appointment.   Unable to leave VM due to VM box is full.  Will try later. Dm/cma

## 2024-04-22 NOTE — Telephone Encounter (Signed)
 Copied from CRM #[id number]. Topic: Clinical - Medication Question >> Apr 21, 2024  4:50 PM Alfonso HERO wrote: Reason for CRM: [facility] called stated the patient usually gets a 90 day supply of atorvastatin  (LIPITOR) 40 MG tablet and lisinopril  (ZESTRIL ) 40 MG tablet but now starting this year he can rcvd 100 day supply. Asking that when a refill is sent can anew Rx for the 100 day be sent instead of the 90.

## 2024-05-01 NOTE — Telephone Encounter (Signed)
 Tried calling number in chart to advise that he is over due for a follow up appointment.   Unable to leave VM due to VM box is full.  Will try later. Dm/cma

## 2024-05-05 ENCOUNTER — Encounter: Payer: Self-pay | Admitting: Family Medicine

## 2024-05-05 NOTE — Telephone Encounter (Signed)
Letter printed and mailed to home address.  Dm/cma
